# Patient Record
Sex: Female | Born: 1990 | Race: White | Hispanic: No | Marital: Single | Smoking: Current every day smoker
Health system: Southern US, Community
[De-identification: ages and names within clinical notes are randomized; demographics above are authoritative.]

## PROBLEM LIST (undated history)

## (undated) DIAGNOSIS — IMO0002 Reserved for concepts with insufficient information to code with codable children: Secondary | ICD-10-CM

## (undated) DIAGNOSIS — F411 Generalized anxiety disorder: Secondary | ICD-10-CM

## (undated) DIAGNOSIS — F339 Major depressive disorder, recurrent, unspecified: Secondary | ICD-10-CM

## (undated) DIAGNOSIS — I959 Hypotension, unspecified: Secondary | ICD-10-CM

## (undated) DIAGNOSIS — N19 Unspecified kidney failure: Secondary | ICD-10-CM

## (undated) DIAGNOSIS — E079 Disorder of thyroid, unspecified: Secondary | ICD-10-CM

## (undated) DIAGNOSIS — E031 Congenital hypothyroidism without goiter: Secondary | ICD-10-CM

## (undated) DIAGNOSIS — F410 Panic disorder [episodic paroxysmal anxiety] without agoraphobia: Secondary | ICD-10-CM

## (undated) HISTORY — PX: NO PAST SURGERIES: SHX2092

## (undated) HISTORY — DX: Major depressive disorder, recurrent, unspecified: F33.9

## (undated) HISTORY — DX: Panic disorder (episodic paroxysmal anxiety): F41.0

## (undated) HISTORY — DX: Congenital hypothyroidism without goiter: E03.1

## (undated) HISTORY — DX: Generalized anxiety disorder: F41.1

## (undated) HISTORY — PX: OVARIAN CYST REMOVAL: SHX89

## (undated) HISTORY — DX: Reserved for concepts with insufficient information to code with codable children: IMO0002

## (undated) HISTORY — DX: Disorder of thyroid, unspecified: E07.9

---
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
## 2011-01-08 ENCOUNTER — Other Ambulatory Visit: Payer: Self-pay | Admitting: "Endocrinology

## 2011-01-09 NOTE — Telephone Encounter (Signed)
Have patient call office

## 2011-01-13 ENCOUNTER — Other Ambulatory Visit: Payer: Self-pay | Admitting: "Endocrinology

## 2011-01-13 ENCOUNTER — Encounter: Payer: Self-pay | Admitting: *Deleted

## 2011-01-23 ENCOUNTER — Encounter: Payer: Self-pay | Admitting: Maternal & Fetal Medicine

## 2011-05-20 ENCOUNTER — Observation Stay: Payer: Self-pay | Admitting: Obstetrics & Gynecology

## 2011-06-18 ENCOUNTER — Inpatient Hospital Stay: Payer: Self-pay | Admitting: Obstetrics and Gynecology

## 2011-07-11 ENCOUNTER — Inpatient Hospital Stay: Payer: Self-pay | Admitting: Obstetrics & Gynecology

## 2011-07-16 LAB — PATHOLOGY REPORT

## 2011-12-25 ENCOUNTER — Emergency Department: Payer: Self-pay | Admitting: *Deleted

## 2011-12-25 LAB — URINALYSIS, COMPLETE
Bilirubin,UR: NEGATIVE
Ketone: NEGATIVE
Ph: 5 (ref 4.5–8.0)
Protein: 30
RBC,UR: 10 /HPF (ref 0–5)
Specific Gravity: 1.024 (ref 1.003–1.030)
Squamous Epithelial: 4
WBC UR: 15 /HPF (ref 0–5)

## 2011-12-25 LAB — PREGNANCY, URINE: Pregnancy Test, Urine: NEGATIVE m[IU]/mL

## 2011-12-27 ENCOUNTER — Emergency Department: Payer: Self-pay | Admitting: Emergency Medicine

## 2011-12-30 ENCOUNTER — Ambulatory Visit: Payer: Self-pay | Admitting: Family Medicine

## 2011-12-30 ENCOUNTER — Encounter: Payer: Self-pay | Admitting: Family Medicine

## 2012-01-31 ENCOUNTER — Emergency Department: Payer: Self-pay | Admitting: Emergency Medicine

## 2012-01-31 LAB — URINALYSIS, COMPLETE
Glucose,UR: NEGATIVE mg/dL (ref 0–75)
Hyaline Cast: 2
Ketone: NEGATIVE
Nitrite: NEGATIVE
Ph: 5 (ref 4.5–8.0)
Protein: 30
Specific Gravity: 1.027 (ref 1.003–1.030)
WBC UR: 18 /HPF (ref 0–5)

## 2012-01-31 LAB — BASIC METABOLIC PANEL
BUN: 30 mg/dL — ABNORMAL HIGH (ref 7–18)
Chloride: 106 mmol/L (ref 98–107)
Creatinine: 1.31 mg/dL — ABNORMAL HIGH (ref 0.60–1.30)
EGFR (Non-African Amer.): 59 — ABNORMAL LOW
Potassium: 4.2 mmol/L (ref 3.5–5.1)
Sodium: 138 mmol/L (ref 136–145)

## 2012-01-31 LAB — CBC
HCT: 37.2 % (ref 35.0–47.0)
MCH: 31.8 pg (ref 26.0–34.0)
MCHC: 33.3 g/dL (ref 32.0–36.0)
RDW: 15.1 % — ABNORMAL HIGH (ref 11.5–14.5)

## 2012-01-31 LAB — PREGNANCY, URINE: Pregnancy Test, Urine: NEGATIVE m[IU]/mL

## 2012-02-12 ENCOUNTER — Emergency Department: Payer: Self-pay | Admitting: Emergency Medicine

## 2012-02-12 LAB — CBC
HGB: 12.2 g/dL (ref 12.0–16.0)
MCH: 31.1 pg (ref 26.0–34.0)
MCV: 96 fL (ref 80–100)
Platelet: 237 10*3/uL (ref 150–440)
RBC: 3.91 10*6/uL (ref 3.80–5.20)
RDW: 15 % — ABNORMAL HIGH (ref 11.5–14.5)
WBC: 8.6 10*3/uL (ref 3.6–11.0)

## 2012-02-12 LAB — URINALYSIS, COMPLETE
Specific Gravity: 1.022 (ref 1.003–1.030)
WBC UR: 122 /HPF (ref 0–5)

## 2012-02-12 LAB — TSH: Thyroid Stimulating Horm: >100 u[IU]/mL — ABNORMAL HIGH

## 2012-02-12 LAB — PREGNANCY, URINE: Pregnancy Test, Urine: NEGATIVE m[IU]/mL

## 2012-02-12 LAB — WET PREP, GENITAL

## 2012-04-14 ENCOUNTER — Emergency Department: Payer: Self-pay | Admitting: Emergency Medicine

## 2012-04-14 LAB — URINALYSIS, COMPLETE
Bacteria: NONE SEEN
Glucose,UR: NEGATIVE mg/dL (ref 0–75)
Specific Gravity: 1.019 (ref 1.003–1.030)
Squamous Epithelial: 10
WBC UR: 615 /HPF (ref 0–5)

## 2012-04-17 ENCOUNTER — Emergency Department: Payer: Self-pay | Admitting: Unknown Physician Specialty

## 2012-04-17 LAB — URINALYSIS, COMPLETE
Glucose,UR: NEGATIVE mg/dL (ref 0–75)
Nitrite: NEGATIVE
Ph: 6 (ref 4.5–8.0)
Protein: >=500
Specific Gravity: 1.023 (ref 1.003–1.030)
Transitional Epi: 3
WBC UR: 635 /HPF (ref 0–5)

## 2012-04-17 LAB — COMPREHENSIVE METABOLIC PANEL
Albumin: 5 g/dL (ref 3.4–5.0)
Anion Gap: 10 (ref 7–16)
BUN: 25 mg/dL — ABNORMAL HIGH (ref 7–18)
Bilirubin,Total: 0.5 mg/dL (ref 0.2–1.0)
Calcium, Total: 9.4 mg/dL (ref 8.5–10.1)
Creatinine: 1.41 mg/dL — ABNORMAL HIGH (ref 0.60–1.30)
Glucose: 96 mg/dL (ref 65–99)
Osmolality: 286 (ref 275–301)
SGOT(AST): 26 U/L (ref 15–37)
SGPT (ALT): 34 U/L (ref 12–78)
Sodium: 141 mmol/L (ref 136–145)
Total Protein: 8.8 g/dL — ABNORMAL HIGH (ref 6.4–8.2)

## 2012-04-17 LAB — PROTIME-INR: INR: 1

## 2012-04-17 LAB — LIPASE, BLOOD: Lipase: 84 U/L (ref 73–393)

## 2012-04-17 LAB — CBC
HCT: 36.5 % (ref 35.0–47.0)
MCV: 96 fL (ref 80–100)
Platelet: 248 10*3/uL (ref 150–440)
RBC: 3.81 10*6/uL (ref 3.80–5.20)
WBC: 9.7 10*3/uL (ref 3.6–11.0)

## 2012-04-17 LAB — TSH: Thyroid Stimulating Horm: 99.7 u[IU]/mL — ABNORMAL HIGH

## 2012-07-28 LAB — CBC
HCT: 40.5 % (ref 35.0–47.0)
HGB: 13.5 g/dL (ref 12.0–16.0)
MCH: 33.4 pg (ref 26.0–34.0)
MCHC: 33.5 g/dL (ref 32.0–36.0)
MCV: 100 fL (ref 80–100)
Platelet: 228 x10 3/mm 3 (ref 150–440)
RBC: 4.05 X10 6/mm 3 (ref 3.80–5.20)
RDW: 14 % (ref 11.5–14.5)
WBC: 11.9 x10 3/mm 3 — ABNORMAL HIGH (ref 3.6–11.0)

## 2012-07-28 LAB — COMPREHENSIVE METABOLIC PANEL
Albumin: 4.6 g/dL (ref 3.4–5.0)
Alkaline Phosphatase: 71 U/L (ref 50–136)
Anion Gap: 9 (ref 7–16)
Bilirubin,Total: 0.4 mg/dL (ref 0.2–1.0)
Calcium, Total: 8.6 mg/dL (ref 8.5–10.1)
Co2: 25 mmol/L (ref 21–32)
Creatinine: 1.09 mg/dL (ref 0.60–1.30)
EGFR (Non-African Amer.): >60
Glucose: 167 mg/dL — ABNORMAL HIGH (ref 65–99)
Osmolality: 284 (ref 275–301)
SGOT(AST): 25 U/L (ref 15–37)
SGPT (ALT): 21 U/L (ref 12–78)

## 2012-07-28 LAB — DRUG SCREEN, URINE
Amphetamines, Ur Screen: NEGATIVE (ref ?–1000)
Barbiturates, Ur Screen: NEGATIVE (ref ?–200)
Benzodiazepine, Ur Scrn: POSITIVE (ref ?–200)
Cannabinoid 50 Ng, Ur ~~LOC~~: POSITIVE (ref ?–50)
Methadone, Ur Screen: NEGATIVE (ref ?–300)
Opiate, Ur Screen: NEGATIVE (ref ?–300)
Tricyclic, Ur Screen: NEGATIVE (ref ?–1000)

## 2012-07-28 LAB — ETHANOL
Ethanol %: <0.003 % (ref 0.000–0.080)
Ethanol: <3 mg/dL

## 2012-07-28 LAB — URINALYSIS, COMPLETE
Ketone: NEGATIVE
Protein: 30
RBC,UR: 3 /HPF (ref 0–5)

## 2012-07-28 LAB — ACETAMINOPHEN LEVEL: Acetaminophen: <2 ug/mL

## 2012-07-28 LAB — SALICYLATE LEVEL: Salicylates, Serum: 4.4 mg/dL — ABNORMAL HIGH

## 2012-07-29 ENCOUNTER — Inpatient Hospital Stay: Payer: Self-pay | Admitting: Internal Medicine

## 2012-07-30 ENCOUNTER — Inpatient Hospital Stay: Payer: Self-pay | Admitting: Psychiatry

## 2012-07-30 LAB — URINE CULTURE

## 2012-12-19 ENCOUNTER — Emergency Department: Payer: Self-pay | Admitting: Emergency Medicine

## 2012-12-19 ENCOUNTER — Ambulatory Visit: Payer: Self-pay | Admitting: Family Medicine

## 2012-12-19 LAB — URINALYSIS, COMPLETE
Bilirubin,UR: NEGATIVE
Glucose,UR: NEGATIVE mg/dL (ref 0–75)
Glucose,UR: NEGATIVE mg/dL (ref 0–75)
Nitrite: NEGATIVE
Ph: 7 (ref 4.5–8.0)
Protein: NEGATIVE
Squamous Epithelial: 12

## 2012-12-19 LAB — CBC
HGB: 12.7 g/dL (ref 12.0–16.0)
Platelet: 200 10*3/uL (ref 150–440)
RBC: 3.82 10*6/uL (ref 3.80–5.20)
RDW: 15.7 % — ABNORMAL HIGH (ref 11.5–14.5)

## 2012-12-19 LAB — BASIC METABOLIC PANEL
Anion Gap: 4 — ABNORMAL LOW (ref 7–16)
Co2: 27 mmol/L (ref 21–32)
EGFR (Non-African Amer.): >60
Glucose: 104 mg/dL — ABNORMAL HIGH (ref 65–99)
Osmolality: 272 (ref 275–301)
Potassium: 3.9 mmol/L (ref 3.5–5.1)
Sodium: 136 mmol/L (ref 136–145)

## 2013-02-21 ENCOUNTER — Ambulatory Visit: Payer: Self-pay

## 2013-03-01 ENCOUNTER — Ambulatory Visit: Payer: Self-pay | Admitting: Family Medicine

## 2013-03-09 ENCOUNTER — Emergency Department: Payer: Self-pay | Admitting: Emergency Medicine

## 2013-03-25 ENCOUNTER — Observation Stay: Payer: Self-pay | Admitting: Obstetrics and Gynecology

## 2013-03-25 LAB — URINALYSIS, COMPLETE
Blood: NEGATIVE
Glucose,UR: NEGATIVE mg/dL (ref 0–75)
Ketone: NEGATIVE
Nitrite: NEGATIVE
RBC,UR: 4 /HPF (ref 0–5)
Squamous Epithelial: 2
WBC UR: 13 /HPF (ref 0–5)

## 2013-03-27 LAB — URINE CULTURE

## 2013-04-03 ENCOUNTER — Observation Stay: Payer: Self-pay

## 2013-04-03 LAB — FETAL FIBRONECTIN: Fetal Fibronectin: NEGATIVE

## 2013-04-27 ENCOUNTER — Ambulatory Visit: Payer: Self-pay | Admitting: Podiatry

## 2013-05-02 ENCOUNTER — Observation Stay: Payer: Self-pay

## 2013-05-02 LAB — URINALYSIS, COMPLETE
Bilirubin,UR: NEGATIVE
Blood: NEGATIVE
Glucose,UR: NEGATIVE mg/dL (ref 0–75)
Ph: 6 (ref 4.5–8.0)
Protein: NEGATIVE
RBC,UR: NONE SEEN /HPF (ref 0–5)
Specific Gravity: 1.017 (ref 1.003–1.030)
Squamous Epithelial: 22
WBC UR: 5 /HPF (ref 0–5)

## 2013-05-02 LAB — FETAL FIBRONECTIN

## 2013-06-07 ENCOUNTER — Observation Stay: Payer: Self-pay | Admitting: Obstetrics and Gynecology

## 2013-06-13 ENCOUNTER — Observation Stay: Payer: Self-pay | Admitting: Obstetrics and Gynecology

## 2013-06-14 ENCOUNTER — Inpatient Hospital Stay: Payer: Self-pay | Admitting: Obstetrics and Gynecology

## 2013-06-14 LAB — CBC WITH DIFFERENTIAL/PLATELET
Basophil %: 0.4 %
Eosinophil #: 0 10*3/uL (ref 0.0–0.7)
Eosinophil %: 0.1 %
Lymphocyte #: 3 10*3/uL (ref 1.0–3.6)
Lymphocyte %: 13.7 %
MCH: 33.1 pg (ref 26.0–34.0)
MCHC: 34 g/dL (ref 32.0–36.0)
Monocyte #: 1.1 x10 3/mm — ABNORMAL HIGH (ref 0.2–0.9)
Monocyte %: 4.9 %
Neutrophil %: 80.9 %
Platelet: 181 10*3/uL (ref 150–440)
RDW: 14.7 % — ABNORMAL HIGH (ref 11.5–14.5)
WBC: 22.2 10*3/uL — ABNORMAL HIGH (ref 3.6–11.0)

## 2013-06-14 LAB — TSH: Thyroid Stimulating Horm: >100 u[IU]/mL — ABNORMAL HIGH

## 2013-06-15 ENCOUNTER — Inpatient Hospital Stay: Payer: Self-pay | Admitting: Obstetrics and Gynecology

## 2013-06-15 LAB — CBC WITH DIFFERENTIAL/PLATELET
Basophil %: 0.4 %
HGB: 11.1 g/dL — ABNORMAL LOW (ref 12.0–16.0)
Lymphocyte #: 3.1 10*3/uL (ref 1.0–3.6)
MCH: 33.5 pg (ref 26.0–34.0)
MCHC: 34.1 g/dL (ref 32.0–36.0)
Monocyte %: 5 %
Neutrophil #: 21.1 10*3/uL — ABNORMAL HIGH (ref 1.4–6.5)
Neutrophil %: 82.5 %
Platelet: 184 10*3/uL (ref 150–440)
WBC: 25.6 10*3/uL — ABNORMAL HIGH (ref 3.6–11.0)

## 2013-06-16 LAB — GC/CHLAMYDIA PROBE AMP

## 2013-06-16 LAB — HEMATOCRIT: HCT: 20.2 % — ABNORMAL LOW (ref 35.0–47.0)

## 2013-06-17 LAB — CBC WITH DIFFERENTIAL/PLATELET
Basophil #: 0.1 10*3/uL (ref 0.0–0.1)
Basophil %: 0.2 %
Eosinophil #: 0.2 10*3/uL (ref 0.0–0.7)
Eosinophil %: 0.6 %
HCT: 18.3 % — ABNORMAL LOW (ref 35.0–47.0)
HGB: 6.2 g/dL — ABNORMAL LOW (ref 12.0–16.0)
MCHC: 33.8 g/dL (ref 32.0–36.0)
Neutrophil #: 27.3 10*3/uL — ABNORMAL HIGH (ref 1.4–6.5)
Neutrophil %: 79.9 %
Platelet: 154 10*3/uL (ref 150–440)
RDW: 14.4 % (ref 11.5–14.5)

## 2013-06-17 LAB — PATHOLOGY REPORT

## 2013-06-17 LAB — URINE CULTURE

## 2013-06-18 LAB — CBC WITH DIFFERENTIAL/PLATELET
Eosinophil #: 0.2 10*3/uL (ref 0.0–0.7)
HCT: 18.8 % — ABNORMAL LOW (ref 35.0–47.0)
MCHC: 34.2 g/dL (ref 32.0–36.0)
MCV: 98 fL (ref 80–100)
Neutrophil #: 16.4 10*3/uL — ABNORMAL HIGH (ref 1.4–6.5)
Neutrophil %: 71.6 %
RBC: 1.92 10*6/uL — ABNORMAL LOW (ref 3.80–5.20)
RDW: 14.4 % (ref 11.5–14.5)
WBC: 22.9 10*3/uL — ABNORMAL HIGH (ref 3.6–11.0)

## 2014-03-03 ENCOUNTER — Emergency Department (HOSPITAL_COMMUNITY)
Admission: EM | Admit: 2014-03-03 | Discharge: 2014-03-03 | Disposition: A | Discharge status: Home / Self Care | Payer: BC Managed Care – PPO | Attending: Emergency Medicine | Admitting: Emergency Medicine

## 2014-03-03 ENCOUNTER — Encounter (HOSPITAL_COMMUNITY): Payer: Self-pay | Admitting: Emergency Medicine

## 2014-03-03 DIAGNOSIS — F17200 Nicotine dependence, unspecified, uncomplicated: Secondary | ICD-10-CM | POA: Insufficient documentation

## 2014-03-03 DIAGNOSIS — E039 Hypothyroidism, unspecified: Secondary | ICD-10-CM | POA: Insufficient documentation

## 2014-03-03 DIAGNOSIS — N12 Tubulo-interstitial nephritis, not specified as acute or chronic: Secondary | ICD-10-CM

## 2014-03-03 DIAGNOSIS — Z792 Long term (current) use of antibiotics: Secondary | ICD-10-CM | POA: Insufficient documentation

## 2014-03-03 DIAGNOSIS — R1013 Epigastric pain: Secondary | ICD-10-CM | POA: Insufficient documentation

## 2014-03-03 DIAGNOSIS — Z3202 Encounter for pregnancy test, result negative: Secondary | ICD-10-CM | POA: Insufficient documentation

## 2014-03-03 DIAGNOSIS — Z79899 Other long term (current) drug therapy: Secondary | ICD-10-CM | POA: Insufficient documentation

## 2014-03-03 LAB — CBC WITH DIFFERENTIAL/PLATELET
BASOS ABS: 0.1 10*3/uL (ref 0.0–0.1)
Basophils Relative: 0 % (ref 0–1)
EOS PCT: 1 % (ref 0–5)
Eosinophils Absolute: 0.1 10*3/uL (ref 0.0–0.7)
HCT: 38.9 % (ref 36.0–46.0)
Hemoglobin: 12.7 g/dL (ref 12.0–15.0)
LYMPHS ABS: 4.5 10*3/uL — AB (ref 0.7–4.0)
LYMPHS PCT: 33 % (ref 12–46)
MCH: 30.9 pg (ref 26.0–34.0)
MCHC: 32.6 g/dL (ref 30.0–36.0)
MCV: 94.6 fL (ref 78.0–100.0)
Monocytes Absolute: 0.5 10*3/uL (ref 0.1–1.0)
Monocytes Relative: 4 % (ref 3–12)
NEUTROS PCT: 62 % (ref 43–77)
Neutro Abs: 8.4 10*3/uL — ABNORMAL HIGH (ref 1.7–7.7)
PLATELETS: 223 10*3/uL (ref 150–400)
RBC: 4.11 MIL/uL (ref 3.87–5.11)
RDW: 16.8 % — AB (ref 11.5–15.5)
WBC: 13.5 10*3/uL — AB (ref 4.0–10.5)

## 2014-03-03 LAB — URINE MICROSCOPIC-ADD ON

## 2014-03-03 LAB — COMPREHENSIVE METABOLIC PANEL
ALK PHOS: 69 U/L (ref 39–117)
ALT: 29 U/L (ref 0–35)
AST: 33 U/L (ref 0–37)
Albumin: 4.6 g/dL (ref 3.5–5.2)
Anion gap: 13 (ref 5–15)
BUN: 21 mg/dL (ref 6–23)
CO2: 25 meq/L (ref 19–32)
Calcium: 8.8 mg/dL (ref 8.4–10.5)
Chloride: 104 mEq/L (ref 96–112)
Creatinine, Ser: 1.22 mg/dL — ABNORMAL HIGH (ref 0.50–1.10)
GFR calc Af Amer: 72 mL/min — ABNORMAL LOW (ref >90)
GFR calc non Af Amer: 62 mL/min — ABNORMAL LOW (ref >90)
Glucose, Bld: 101 mg/dL — ABNORMAL HIGH (ref 70–99)
POTASSIUM: 4.4 meq/L (ref 3.7–5.3)
SODIUM: 142 meq/L (ref 137–147)
Total Bilirubin: <0.2 mg/dL — ABNORMAL LOW (ref 0.3–1.2)
Total Protein: 8 g/dL (ref 6.0–8.3)

## 2014-03-03 LAB — URINALYSIS, ROUTINE W REFLEX MICROSCOPIC
Bilirubin Urine: NEGATIVE
GLUCOSE, UA: NEGATIVE mg/dL
Ketones, ur: NEGATIVE mg/dL
Nitrite: NEGATIVE
PH: 7 (ref 5.0–8.0)
Protein, ur: NEGATIVE mg/dL
SPECIFIC GRAVITY, URINE: 1.025 (ref 1.005–1.030)
Urobilinogen, UA: 1 mg/dL (ref 0.0–1.0)

## 2014-03-03 LAB — LIPASE, BLOOD: LIPASE: 46 U/L (ref 11–59)

## 2014-03-03 LAB — POC URINE PREG, ED: Preg Test, Ur: NEGATIVE

## 2014-03-03 MED ORDER — CEPHALEXIN 500 MG PO CAPS
500.0000 mg | ORAL_CAPSULE | Freq: Three times a day (TID) | ORAL | Status: AC
Start: 2014-03-03 — End: 2014-03-17

## 2014-03-03 MED ORDER — DEXTROSE 5 % IV SOLN
1.0000 g | Freq: Once | INTRAVENOUS | Status: AC
  Administered 2014-03-03: 1 g via INTRAVENOUS
  Filled 2014-03-03: qty 10

## 2014-03-03 MED ORDER — SODIUM CHLORIDE 0.9 % IV BOLUS (SEPSIS)
2000.0000 mL | Freq: Once | INTRAVENOUS | Status: AC
  Administered 2014-03-03: 2000 mL via INTRAVENOUS

## 2014-03-03 MED ORDER — OXYCODONE-ACETAMINOPHEN 5-325 MG PO TABS: ORAL_TABLET | ORAL | Status: DC

## 2014-03-03 MED ORDER — HYDROMORPHONE HCL PF 1 MG/ML IJ SOLN
0.5000 mg | Freq: Once | INTRAMUSCULAR | Status: AC
  Administered 2014-03-03: 0.5 mg via INTRAVENOUS
  Filled 2014-03-03: qty 1

## 2014-03-03 MED ORDER — MORPHINE SULFATE 4 MG/ML IJ SOLN
4.0000 mg | Freq: Once | INTRAMUSCULAR | Status: AC
  Administered 2014-03-03: 4 mg via INTRAVENOUS
  Filled 2014-03-03: qty 1

## 2014-03-03 MED ORDER — IOHEXOL 300 MG/ML  SOLN
50.0000 mL | Freq: Once | INTRAMUSCULAR | Status: AC | PRN
  Administered 2014-03-03 (×2): 25 mL via ORAL

## 2014-03-03 NOTE — ED Provider Notes (Signed)
CSN: [id number]     Arrival date & time 03/03/14  1232 History   First MD Initiated Contact with Patient 03/03/14 1245     Chief Complaint  Patient presents with  . Abdominal Pain     (Consider location/radiation/quality/duration/timing/severity/associated sxs/prior Treatment) HPI  Martha Proctor is a 23 y.o. female complaining of worsening epigastric abdominal pain over the course of 3 days associated with urinary frequency, dark-colored foul-smelling urine and decreased by mouth intake over the last 24 hours. Patient states that she has pain in the bilateral kidneys as well, has history of frequent pyelonephritis. She denies fever, chills, nausea, vomiting, abnormal vaginal discharge, chest pain, cough, shortness of breath, nausea or vomiting. No pain medication prior to arrival. Pain is exacerbated by movement. Rated at 10 out of 10, unable to describe nature of pain. No alleviating factors identified.  Past Medical History  Diagnosis Date  . 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 No pertinent past surgical history. History reviewed. No pertinent family history. History  Substance Use Topics  . Smoking status: Current Every Day Smoker  . Smokeless tobacco: Not on file  . Alcohol Use: No   OB History   Grav Para Term Preterm Abortions TAB SAB Ect Mult Living                 Review of Systems  10 systems reviewed and found to be negative, except as noted in the HPI.   Allergies  Review of patient's allergies indicates no known allergies.  Home Medications   Prior to Admission medications   Medication Sig Start Date End Date Taking? Authorizing Provider  FLUoxetine (PROZAC) 10 MG capsule Take 10 mg by mouth daily.   Yes Historical Provider, MD  levothyroxine (SYNTHROID, LEVOTHROID) 200 MCG tablet Take 200 mcg by mouth daily.     Yes Historical Provider, MD  cephALEXin (KEFLEX) 500 MG capsule Take 1 capsule (500 mg total) by mouth 3  (three) times daily. 03/03/14 03/17/14  Jedediah Noda, PA-C  oxyCODONE-acetaminophen (PERCOCET/ROXICET) 5-325 MG per tablet 1 to 2 tabs PO q6hrs  PRN for pain 03/03/14   Joni Reining Letetia Romanello, PA-C   BP 122/83  Pulse 82  Temp(Src) 98.4 F (36.9 C) (Oral)  Resp 20  Ht 5' (1.524 m)  Wt 98 lb (44.453 kg)  BMI 19.14 kg/m2  SpO2 100% Physical Exam  Nursing note and vitals reviewed. Constitutional: She is oriented to person, place, and time. She appears well-developed and well-nourished. No distress.  HENT:  Head: Normocephalic.  Mouth/Throat: Oropharynx is clear and moist.  Eyes: Conjunctivae and EOM are normal. Pupils are equal, round, and reactive to light.  Neck: Normal range of motion.  Cardiovascular: Normal rate, regular rhythm and intact distal pulses.   Pulmonary/Chest: Effort normal. No stridor.  Abdominal: She exhibits no distension and no mass. There is tenderness. There is guarding. There is no rebound.  Hyperactive bowel sounds, patient is diffusely tender to palpation with voluntary guarding and no rebound. She is exquisitely tender to palpation in the epigastrium.  Genitourinary:  Exquisite bilateral CVA tenderness to palpation  Musculoskeletal: Normal range of motion.  Neurological: She is alert and oriented to person, place, and time.  Psychiatric: She has a normal mood and affect.    ED Course  Procedures (including critical care time) Labs Review Labs Reviewed  CBC WITH DIFFERENTIAL - Abnormal; Notable for the following:    WBC 13.5 (*)    RDW 16.8 (*)  Neutro Abs 8.4 (*)    Lymphs Abs 4.5 (*)    All other components within normal limits  COMPREHENSIVE METABOLIC PANEL - Abnormal; Notable for the following:    Glucose, Bld 101 (*)    Creatinine, Ser 1.22 (*)    Total Bilirubin <0.2 (*)    GFR calc non Af Amer 62 (*)    GFR calc Af Amer 72 (*)    All other components within normal limits  URINALYSIS, ROUTINE W REFLEX MICROSCOPIC - Abnormal; Notable for the  following:    APPearance CLOUDY (*)    Hgb urine dipstick SMALL (*)    Leukocytes, UA MODERATE (*)    All other components within normal limits  URINE MICROSCOPIC-ADD ON - Abnormal; Notable for the following:    Squamous Epithelial / LPF MANY (*)    Bacteria, UA MANY (*)    All other components within normal limits  URINE CULTURE  LIPASE, BLOOD  POC URINE PREG, ED    Imaging Review No results found.   EKG Interpretation None      MDM   Final diagnoses:  Pyelonephritis    Filed Vitals:   03/03/14 1239  BP: 122/83  Pulse: 82  Temp: 98.4 F (36.9 C)  TempSrc: Oral  Resp: 20  Height: 5' (1.524 m)  Weight: 98 lb (44.453 kg)  SpO2: 100%    Medications  morphine 4 MG/ML injection 4 mg (4 mg Intravenous Given 03/03/14 1352)  iohexol (OMNIPAQUE) 300 MG/ML solution 50 mL (25 mLs Oral Contrast Given 03/03/14 1500)  sodium chloride 0.9 % bolus 2,000 mL (2,000 mLs Intravenous New Bag/Given 03/03/14 1352)  cefTRIAXone (ROCEPHIN) 1 g in dextrose 5 % 50 mL IVPB (0 g Intravenous Stopped 03/03/14 1508)  HYDROmorphone (DILAUDID) injection 0.5 mg (0.5 mg Intravenous Given 03/03/14 1508)    Martha Proctor is a 23 y.o. female presenting with epigastric (note that this contradicts triage note) abdominal pain associated with urinary frequency, foul-smelling and concentrated urine, bilateral flank pain and decreased by mouth intake. On physical exam the patient has exquisitely tender to palpation abdomen. This could be a typical for pyelonephritis. Will order CT abdomen pelvis with contrast to reassess.  On repeat abdominal exam she has no lumbar tender to palpation along the epigastrium, she still has bilateral CVA tenderness.  3:45 PM patient reassessed with benign abdominal exam, no tenderness to palpation. I have discussed the pros and cons of obtaining CT scan, and shared decision-making capacity we have chosen to defer the CT scan exam at this time. We have had an extensive discussion of  return precautions. We'll treat for pyelonephritis with Keflex, urine culture is sent. Patient will be given Percocet for pain control.  Evaluation does not show pathology that would require ongoing emergent intervention or inpatient treatment. Pt is hemodynamically stable and mentating appropriately. Discussed findings and plan with patient/guardian, who agrees with care plan. All questions answered. Return precautions discussed and outpatient follow up given.   New Prescriptions   CEPHALEXIN (KEFLEX) 500 MG CAPSULE    Take 1 capsule (500 mg total) by mouth 3 (three) times daily.   OXYCODONE-ACETAMINOPHEN (PERCOCET/ROXICET) 5-325 MG PER TABLET    1 to 2 tabs PO q6hrs  PRN for pain         Martha Emeryicole Kariana Wiles, PA-C 03/03/14 1601

## 2014-03-03 NOTE — Discharge Instructions (Signed)
Take your antibiotics as directed and to completion. You should never have any leftover antibiotics! Push fluids and stay well hydrated.   Push fluids: take small frequent sips of water or Gatorade, do not drink any soda, juice or caffeinated beverages.    Take percocet for breakthrough pain, do not drink alcohol, drive, care for children or do other critical tasks while taking percocet.  Please follow with your primary care doctor in the next 2 days for a check-up. They must obtain records for further management.   Do not hesitate to return to the Emergency Department for any new, worsening or concerning symptoms.    Pyelonephritis, Adult Pyelonephritis is a kidney infection. In general, there are 2 main types of pyelonephritis:  Infections that come on quickly without any warning (acute pyelonephritis).  Infections that persist for a long period of time (chronic pyelonephritis). CAUSES  Two main causes of pyelonephritis are:  Bacteria traveling from the bladder to the kidney. This is a problem especially in pregnant women. The urine in the bladder can become filled with bacteria from multiple causes, including:  Inflammation of the prostate gland (prostatitis).  Sexual intercourse in females.  Bladder infection (cystitis).  Bacteria traveling from the bloodstream to the tissue part of the kidney. Problems that may increase your risk of getting a kidney infection include:  Diabetes.  Kidney stones or bladder stones.  Cancer.  Catheters placed in the bladder.  Other abnormalities of the kidney or ureter. SYMPTOMS   Abdominal pain.  Pain in the side or flank area.  Fever.  Chills.  Upset stomach.  Blood in the urine (dark urine).  Frequent urination.  Strong or persistent urge to urinate.  Burning or stinging when urinating. DIAGNOSIS  Your caregiver may diagnose your kidney infection based on your symptoms. A urine sample may also be taken. TREATMENT    In general, treatment depends on how severe the infection is.   If the infection is mild and caught early, your caregiver may treat you with oral antibiotics and send you home.  If the infection is more severe, the bacteria may have gotten into the bloodstream. This will require intravenous (IV) antibiotics and a hospital stay. Symptoms may include:  High fever.  Severe flank pain.  Shaking chills.  Even after a hospital stay, your caregiver may require you to be on oral antibiotics for a period of time.  Other treatments may be required depending upon the cause of the infection. HOME CARE INSTRUCTIONS   Take your antibiotics as directed. Finish them even if you start to feel better.  Make an appointment to have your urine checked to make sure the infection is gone.  Drink enough fluids to keep your urine clear or pale yellow.  Take medicines for the bladder if you have urgency and frequency of urination as directed by your caregiver. SEEK IMMEDIATE MEDICAL CARE IF:   You have a fever or persistent symptoms for more than 2-3 days.  You have a fever and your symptoms suddenly get worse.  You are unable to take your antibiotics or fluids.  You develop shaking chills.  You experience extreme weakness or fainting.  There is no improvement after 2 days of treatment. MAKE SURE YOU:  Understand these instructions.  Will watch your condition.  Will get help right away if you are not doing well or get worse. Document Released: 07/14/2005 Document Revised: 01/13/2012 Document Reviewed: 12/18/2010 [facility] : [facility] Patient Information 2015 [employer], [place]. This information is not intended to  replace advice given to you by your health care provider. Make sure you discuss any questions you have with your health care provider.

## 2014-03-03 NOTE — ED Provider Notes (Signed)
Medical screening examination/treatment/procedure(s) were performed by non-physician practitioner and as supervising physician I was immediately available for consultation/collaboration.   EKG Interpretation None        Layla MawKristen N Ward, DO 03/03/14 1606

## 2014-03-03 NOTE — ED Notes (Signed)
Per pt sts 3 days of lower abdominal pain and radiating into her kidneys. sts she thinks she has a kidney infection.

## 2014-03-06 LAB — URINE CULTURE: Colony Count: >=100000

## 2014-03-07 ENCOUNTER — Telehealth (HOSPITAL_BASED_OUTPATIENT_CLINIC_OR_DEPARTMENT_OTHER): Payer: Self-pay | Admitting: Emergency Medicine

## 2014-03-07 NOTE — Telephone Encounter (Signed)
Post ED Visit - Positive Culture Follow-up  Culture report reviewed by antimicrobial stewardship pharmacist: []  Wes Dulaney, Pharm.D., BCPS []  Celedonio MiyamotoJeremy Frens, Pharm.D., BCPS []  Georgina PillionElizabeth Martin, Pharm.D., BCPS []  MerrimacMinh Pham, 17[address] Rainbow BoulevardPharm.D., BCPS, AAHIVP []  Estella HuskMichelle Turner, Pharm.D., BCPS, AAHIVP [x]  Red ChristiansSamson Lee, Pharm.D. []  Tennis Mustassie Stewart, Pharm.D.  Positive urine culture >100,000 colonie/ml E. Coli Treated with Cephalexin 500 mg caps, 1 capsule tid x 14 days, organism sensitive to the same and no further patient follow-up is required at this time.  Berle MullMiller, Codi Folkerts 03/07/2014, 11:20 AM

## 2014-05-19 ENCOUNTER — Ambulatory Visit: Payer: Self-pay | Admitting: Emergency Medicine

## 2014-05-19 ENCOUNTER — Ambulatory Visit: Payer: Self-pay | Admitting: Family Medicine

## 2014-05-19 LAB — CBC WITH DIFFERENTIAL/PLATELET
Basophil #: 0.1 10*3/uL (ref 0.0–0.1)
Basophil %: 0.8 %
Eosinophil #: 0 10*3/uL (ref 0.0–0.7)
Eosinophil %: 0.2 %
HCT: 36.7 % (ref 35.0–47.0)
HGB: 12.3 g/dL (ref 12.0–16.0)
Lymphocyte #: 2.4 10*3/uL (ref 1.0–3.6)
Lymphocyte %: 13.7 %
MCH: 33.5 pg (ref 26.0–34.0)
MCHC: 33.7 g/dL (ref 32.0–36.0)
MCV: 99 fL (ref 80–100)
Monocyte #: 0.7 x10 3/mm (ref 0.2–0.9)
Monocyte %: 4.1 %
Neutrophil #: 14.3 10*3/uL — ABNORMAL HIGH (ref 1.4–6.5)
Neutrophil %: 81.2 %
Platelet: 278 10*3/uL (ref 150–440)
RBC: 3.69 10*6/uL — ABNORMAL LOW (ref 3.80–5.20)
RDW: 13.9 % (ref 11.5–14.5)
WBC: 17.6 10*3/uL — ABNORMAL HIGH (ref 3.6–11.0)

## 2014-05-19 LAB — URINALYSIS, COMPLETE
Bilirubin,UR: NEGATIVE
Glucose,UR: NEGATIVE
KETONE: NEGATIVE
NITRITE: POSITIVE
Ph: 5.5 (ref 5.0–8.0)
Protein: 100
WBC UR: >30 /HPF (ref 0–5)

## 2014-05-19 LAB — COMPREHENSIVE METABOLIC PANEL
Albumin: 3.8 g/dL (ref 3.4–5.0)
Alkaline Phosphatase: 61 U/L
Anion Gap: 8 (ref 7–16)
BUN: 18 mg/dL (ref 7–18)
Bilirubin,Total: 0.3 mg/dL (ref 0.2–1.0)
Calcium, Total: 8.5 mg/dL (ref 8.5–10.1)
Chloride: 103 mmol/L (ref 98–107)
Co2: 28 mmol/L (ref 21–32)
Creatinine: 0.9 mg/dL (ref 0.60–1.30)
EGFR (African American): >60
EGFR (Non-African Amer.): >60
Glucose: 96 mg/dL (ref 65–99)
Osmolality: 279 (ref 275–301)
Potassium: 4 mmol/L (ref 3.5–5.1)
SGOT(AST): 14 U/L — ABNORMAL LOW (ref 15–37)
SGPT (ALT): 17 U/L
Sodium: 139 mmol/L (ref 136–145)
Total Protein: 7 g/dL (ref 6.4–8.2)

## 2014-05-19 LAB — LIPASE, BLOOD: LIPASE: 76 U/L (ref 73–393)

## 2014-05-19 LAB — AMYLASE: AMYLASE: 40 U/L (ref 25–115)

## 2014-05-19 LAB — PREGNANCY, URINE: Pregnancy Test, Urine: NEGATIVE m[IU]/mL

## 2014-05-21 LAB — URINE CULTURE

## 2014-07-26 ENCOUNTER — Encounter: Payer: Self-pay | Admitting: Family Medicine

## 2014-07-26 ENCOUNTER — Ambulatory Visit (INDEPENDENT_AMBULATORY_CARE_PROVIDER_SITE_OTHER): Payer: BC Managed Care – PPO | Admitting: Family Medicine

## 2014-07-26 VITALS — BP 98/52 | HR 75 | Temp 98.2°F | Wt 101.0 lb

## 2014-07-26 DIAGNOSIS — F339 Major depressive disorder, recurrent, unspecified: Secondary | ICD-10-CM

## 2014-07-26 DIAGNOSIS — F410 Panic disorder [episodic paroxysmal anxiety] without agoraphobia: Secondary | ICD-10-CM | POA: Insufficient documentation

## 2014-07-26 DIAGNOSIS — F411 Generalized anxiety disorder: Secondary | ICD-10-CM

## 2014-07-26 DIAGNOSIS — R634 Abnormal weight loss: Secondary | ICD-10-CM

## 2014-07-26 DIAGNOSIS — IMO0002 Reserved for concepts with insufficient information to code with codable children: Secondary | ICD-10-CM

## 2014-07-26 DIAGNOSIS — Z62810 Personal history of physical and sexual abuse in childhood: Secondary | ICD-10-CM

## 2014-07-26 HISTORY — DX: Generalized anxiety disorder: F41.1

## 2014-07-26 HISTORY — DX: Reserved for concepts with insufficient information to code with codable children: IMO0002

## 2014-07-26 HISTORY — DX: Major depressive disorder, recurrent, unspecified: F33.9

## 2014-07-26 HISTORY — DX: Panic disorder (episodic paroxysmal anxiety): F41.0

## 2014-07-26 MED ORDER — CLONAZEPAM 1 MG PO TABS: 1.0000 mg | ORAL_TABLET | Freq: Two times a day (BID) | ORAL | Status: DC

## 2014-07-26 MED ORDER — ESCITALOPRAM OXALATE 5 MG PO TABS: 5.0000 mg | ORAL_TABLET | Freq: Every day | ORAL | Status: DC

## 2014-07-26 NOTE — Progress Notes (Signed)
Pre visit review using our clinic review tool, if applicable. No additional management support is needed unless otherwise documented below in the visit note. 
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
## 2014-08-02 ENCOUNTER — Telehealth: Payer: Self-pay | Admitting: Family Medicine

## 2014-08-02 NOTE — Telephone Encounter (Signed)
emmi mailed  °

## 2014-08-16 ENCOUNTER — Telehealth: Payer: Self-pay | Admitting: Family Medicine

## 2014-08-16 ENCOUNTER — Ambulatory Visit: Payer: BC Managed Care – PPO | Admitting: Family Medicine

## 2014-08-16 NOTE — Telephone Encounter (Signed)
Spoke with Martha Proctor. She states she totally forgot about her appointment today.  Other than the Klonopin making her sleepy, she states she is doing good and it has really helped.  She was thankful that Dr. Patsy Lageropland helped up out.  Appointment rescheduled for Monday 08/21/2014 at 10:15 am.

## 2014-08-16 NOTE — Telephone Encounter (Signed)
Patient did not come for their scheduled appointment today for 3 week follow up.  Please let me know if the patient needs to be contacted immediately for follow up or if no follow up is necessary.   ° °

## 2014-08-16 NOTE — Telephone Encounter (Signed)
Yes, donna, can you please check on her.

## 2014-08-21 ENCOUNTER — Encounter: Payer: Self-pay | Admitting: Family Medicine

## 2014-08-21 ENCOUNTER — Ambulatory Visit (INDEPENDENT_AMBULATORY_CARE_PROVIDER_SITE_OTHER): Payer: BLUE CROSS/BLUE SHIELD | Admitting: Family Medicine

## 2014-08-21 VITALS — BP 80/52 | HR 56 | Temp 98.9°F | Ht 60.5 in | Wt 96.5 lb

## 2014-08-21 DIAGNOSIS — F411 Generalized anxiety disorder: Secondary | ICD-10-CM

## 2014-08-21 DIAGNOSIS — Z62810 Personal history of physical and sexual abuse in childhood: Secondary | ICD-10-CM

## 2014-08-21 DIAGNOSIS — F339 Major depressive disorder, recurrent, unspecified: Secondary | ICD-10-CM

## 2014-08-21 DIAGNOSIS — IMO0002 Reserved for concepts with insufficient information to code with codable children: Secondary | ICD-10-CM

## 2014-08-21 DIAGNOSIS — F410 Panic disorder [episodic paroxysmal anxiety] without agoraphobia: Secondary | ICD-10-CM

## 2014-08-21 MED ORDER — ESCITALOPRAM OXALATE 10 MG PO TABS: 10.0000 mg | ORAL_TABLET | Freq: Every day | ORAL | Status: DC

## 2014-08-21 NOTE — Progress Notes (Signed)
Pre visit review using our clinic review tool, if applicable. No additional management support is needed unless otherwise documented below in the visit note. 
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
## 2014-09-05 ENCOUNTER — Ambulatory Visit: Payer: Self-pay

## 2014-09-12 ENCOUNTER — Other Ambulatory Visit: Payer: Self-pay | Admitting: *Deleted

## 2014-09-12 MED ORDER — ESCITALOPRAM OXALATE 10 MG PO TABS: 10.0000 mg | ORAL_TABLET | Freq: Every day | ORAL | Status: DC

## 2014-09-20 ENCOUNTER — Ambulatory Visit: Payer: BLUE CROSS/BLUE SHIELD | Admitting: Family Medicine

## 2014-09-20 ENCOUNTER — Telehealth: Payer: Self-pay | Admitting: Family Medicine

## 2014-09-20 NOTE — Telephone Encounter (Signed)
Patient did not come for their scheduled appointment today for 1 month follow up Please let me know if the patient needs to be contacted immediately for follow up or if no follow up is necessary.   ° °

## 2014-09-20 NOTE — Telephone Encounter (Signed)
Can you check on her? If she is doing perfectly, then no f/u, o/w I want to check in with her.

## 2014-09-21 NOTE — Telephone Encounter (Signed)
Spoke with patient she is doing well.  She will make an appointment if needed.

## 2014-10-16 ENCOUNTER — Encounter: Payer: Self-pay | Admitting: Family Medicine

## 2014-10-16 ENCOUNTER — Ambulatory Visit (INDEPENDENT_AMBULATORY_CARE_PROVIDER_SITE_OTHER): Payer: BLUE CROSS/BLUE SHIELD | Admitting: Family Medicine

## 2014-10-16 VITALS — BP 90/60 | HR 71 | Temp 98.7°F | Ht 65.0 in | Wt 109.8 lb

## 2014-10-16 DIAGNOSIS — E031 Congenital hypothyroidism without goiter: Secondary | ICD-10-CM | POA: Diagnosis not present

## 2014-10-16 DIAGNOSIS — N23 Unspecified renal colic: Secondary | ICD-10-CM | POA: Diagnosis not present

## 2014-10-16 DIAGNOSIS — R198 Other specified symptoms and signs involving the digestive system and abdomen: Secondary | ICD-10-CM

## 2014-10-16 DIAGNOSIS — Z79899 Other long term (current) drug therapy: Secondary | ICD-10-CM

## 2014-10-16 DIAGNOSIS — F410 Panic disorder [episodic paroxysmal anxiety] without agoraphobia: Secondary | ICD-10-CM

## 2014-10-16 DIAGNOSIS — F339 Major depressive disorder, recurrent, unspecified: Secondary | ICD-10-CM

## 2014-10-16 DIAGNOSIS — R1900 Intra-abdominal and pelvic swelling, mass and lump, unspecified site: Secondary | ICD-10-CM

## 2014-10-16 LAB — POCT URINALYSIS DIPSTICK
BILIRUBIN UA: NEGATIVE
GLUCOSE UA: NEGATIVE
Ketones, UA: NEGATIVE
LEUKOCYTES UA: NEGATIVE
Nitrite, UA: NEGATIVE
PH UA: 7
Protein, UA: NEGATIVE
RBC UA: NEGATIVE
SPEC GRAV UA: 1.02
Urobilinogen, UA: 0.2

## 2014-10-16 LAB — CBC WITH DIFFERENTIAL/PLATELET
Basophils Absolute: 0.1 10*3/uL (ref 0.0–0.1)
Basophils Relative: 0.7 % (ref 0.0–3.0)
EOS PCT: 1.5 % (ref 0.0–5.0)
Eosinophils Absolute: 0.1 10*3/uL (ref 0.0–0.7)
HCT: 39.2 % (ref 36.0–46.0)
Hemoglobin: 13 g/dL (ref 12.0–15.0)
LYMPHS ABS: 3.5 10*3/uL (ref 0.7–4.0)
LYMPHS PCT: 36.3 % (ref 12.0–46.0)
MCHC: 33.1 g/dL (ref 30.0–36.0)
MCV: 96.5 fl (ref 78.0–100.0)
MONOS PCT: 3.7 % (ref 3.0–12.0)
Monocytes Absolute: 0.4 10*3/uL (ref 0.1–1.0)
Neutro Abs: 5.5 10*3/uL (ref 1.4–7.7)
Neutrophils Relative %: 57.8 % (ref 43.0–77.0)
PLATELETS: 234 10*3/uL (ref 150.0–400.0)
RBC: 4.06 Mil/uL (ref 3.87–5.11)
RDW: 16.3 % — ABNORMAL HIGH (ref 11.5–15.5)
WBC: 9.6 10*3/uL (ref 4.0–10.5)

## 2014-10-16 LAB — BASIC METABOLIC PANEL
BUN: 14 mg/dL (ref 6–23)
CHLORIDE: 107 meq/L (ref 96–112)
CO2: 26 meq/L (ref 19–32)
CREATININE: 1.02 mg/dL (ref 0.40–1.20)
Calcium: 8.9 mg/dL (ref 8.4–10.5)
GFR: 71.58 mL/min (ref >60.00)
GLUCOSE: 87 mg/dL (ref 70–99)
Potassium: 4.6 mEq/L (ref 3.5–5.1)
Sodium: 139 mEq/L (ref 135–145)

## 2014-10-16 LAB — T3, FREE: T3 FREE: 1.3 pg/mL — AB (ref 2.3–4.2)

## 2014-10-16 LAB — HEPATIC FUNCTION PANEL
ALBUMIN: 4.1 g/dL (ref 3.5–5.2)
ALT: 25 U/L (ref 0–35)
AST: 20 U/L (ref 0–37)
Alkaline Phosphatase: 45 U/L (ref 39–117)
Bilirubin, Direct: 0 mg/dL (ref 0.0–0.3)
Total Bilirubin: 0.3 mg/dL (ref 0.2–1.2)
Total Protein: 6.5 g/dL (ref 6.0–8.3)

## 2014-10-16 LAB — TSH: TSH: 84.92 u[IU]/mL — ABNORMAL HIGH (ref 0.35–4.50)

## 2014-10-16 LAB — T4, FREE: Free T4: 0.2 ng/dL — ABNORMAL LOW (ref 0.60–1.60)

## 2014-10-16 MED ORDER — CLONAZEPAM 1 MG PO TABS: 1.0000 mg | ORAL_TABLET | Freq: Two times a day (BID) | ORAL | Status: DC

## 2014-10-16 MED ORDER — ESCITALOPRAM OXALATE 10 MG PO TABS: 10.0000 mg | ORAL_TABLET | Freq: Every day | ORAL | Status: DC

## 2014-10-16 NOTE — Progress Notes (Signed)
Pre visit review using our clinic review tool, if applicable. No additional management support is needed unless otherwise documented below in the visit note. 

## 2014-10-16 NOTE — Progress Notes (Signed)
Dr. Karleen Hampshire T. Spring San, MD, CAQ Sports Medicine Primary Care and Sports Medicine [address] [place], [address] Phone: [phone] Fax: [phone]  10/16/2014  Patient: Martha Proctor, MRN: [id number], DOB: [birth year], 24 y.o.  Primary Physician:  [name], MD  Chief Complaint: Follow-up; Medication Refill; and Kidney Issues  Subjective:   Martha Proctor is a 24 y.o. very pleasant female patient who presents with the following:  Interacting better with ex. Mom thinks taking better care ofher kids.   Took a home pregnancy test. Negative - no intercourse in 2 months.  Refill of thyroid medication.   Wt Readings from Last 3 Encounters:  10/16/14 109 lb 12 oz (49.782 kg)  08/21/14 96 lb 8 oz (43.772 kg)  07/26/14 101 lb (45.813 kg)    Eating better.  Yesterday spent whole day with Mom.   Having some minor panic attacks, but no major.  No crying.  Less irritable.  No SI or HI.  Abdomen is also distended - no clear reason.   She also is having pain in her back that she thinks is similar to a case of pyelo that she had before  Past Medical History, Surgical History, Social History, Family History, Problem List, Medications, and Allergies have been reviewed and updated if relevant.   GEN: No acute illnesses, no fevers, chills. GI: No n/v/d, eating normally Pulm: No SOB Interactive and getting along well at home.  Otherwise, ROS is as per the HPI.  Objective:   BP 90/60 mmHg  Pulse 71  Temp(Src) 98.7 F (37.1 C) (Oral)  Ht  (1.651 m)  Wt 109 lb 12 oz (49.782 kg)  BMI 18.26 kg/m2  GEN: WDWN, NAD, Non-toxic, A & O x 3 HEENT: Atraumatic, Normocephalic. Neck supple. No masses, No LAD. Ears and Nose: No external deformity. CV: RRR, No M/G/R. No JVD. No thrill. No extra heart sounds. PULM: CTA B, no wheezes, crackles, rhonchi. No retractions. No resp. distress. No accessory muscle use. ABD: S, NT, mild to moderate distension,  + BS, No rebound, No HSM  EXTR: No c/c/e NEURO Normal gait.  PSYCH: Normally interactive. Conversant. Not depressed or anxious appearing.  Calm demeanor.   Laboratory and Imaging Data: Results for orders placed or performed in visit on 10/16/14  TSH  Result Value Ref Range   TSH 84.92 (H) 0.35 - 4.50 uIU/mL  T4, free  Result Value Ref Range   Free T4 0.20 (L) 0.60 - 1.60 ng/dL  T3, free  Result Value Ref Range   T3, Free 1.3 (L) 2.3 - 4.2 pg/mL  Basic metabolic panel  Result Value Ref Range   Sodium 139 135 - 145 mEq/L   Potassium 4.6 3.5 - 5.1 mEq/L   Chloride 107 96 - 112 mEq/L   CO2 26 19 - 32 mEq/L   Glucose, Bld 87 70 - 99 mg/dL   BUN 14 6 - 23 mg/dL   Creatinine, Ser 8.65 0.40 - 1.20 mg/dL   Calcium 8.9 8.4 - 78.4 mg/dL   GFR 69.62 >95.28 mL/min  CBC with Differential/Platelet  Result Value Ref Range   WBC 9.6 4.0 - 10.5 K/uL   RBC 4.06 3.87 - 5.11 Mil/uL   Hemoglobin 13.0 12.0 - 15.0 g/dL   HCT 41.3 24.4 - 01.0 %   MCV 96.5 78.0 - 100.0 fl   MCHC 33.1 30.0 - 36.0 g/dL   RDW 27.2 (H) 53.6 - 64.4 %   Platelets 234.0 150.0 - 400.0  K/uL   Neutrophils Relative % 57.8 43.0 - 77.0 %   Lymphocytes Relative 36.3 12.0 - 46.0 %   Monocytes Relative 3.7 3.0 - 12.0 %   Eosinophils Relative 1.5 0.0 - 5.0 %   Basophils Relative 0.7 0.0 - 3.0 %   Neutro Abs 5.5 1.4 - 7.7 K/uL   Lymphs Abs 3.5 0.7 - 4.0 K/uL   Monocytes Absolute 0.4 0.1 - 1.0 K/uL   Eosinophils Absolute 0.1 0.0 - 0.7 K/uL   Basophils Absolute 0.1 0.0 - 0.1 K/uL  Hepatic function panel  Result Value Ref Range   Total Bilirubin 0.3 0.2 - 1.2 mg/dL   Bilirubin, Direct 0.0 0.0 - 0.3 mg/dL   Alkaline Phosphatase 45 39 - 117 U/L   AST 20 0 - 37 U/L   ALT 25 0 - 35 U/L   Total Protein 6.5 6.0 - 8.3 g/dL   Albumin 4.1 3.5 - 5.2 g/dL  POCT urinalysis dipstick  Result Value Ref Range   Color, UA yellow    Clarity, UA clear    Glucose, UA negative    Bilirubin, UA negative    Ketones, UA negative    Spec  Grav, UA 1.020    Blood, UA negative    pH, UA 7.0    Protein, UA negative    Urobilinogen, UA 0.2    Nitrite, UA negative    Leukocytes, UA Negative      Assessment and Plan:   Kidney pain - Plan: POCT urinalysis dipstick, Urine culture  Congenital hypothyroidism - Plan: TSH, T4, free, T3, free  Major depression, recurrent, chronic  Panic disorder without agoraphobia  Encounter for long-term current use of medication - Plan: Basic metabolic panel, CBC with Differential/Platelet, Hepatic function panel  Abdominal enlargement - Plan: US Abdomen Complete, US Pelvis Complete, Hepatic function panel  Thyroid out range, will need higher dose and f/u check.  Depression is improved and stable, keep meds same. Klonopin only if needed.  Several months of protruding abdomen with negative pregnancy test, no intercouse, no significant pain. Obtain an abdominal and pelvic ultrasound to eval GB, pancrease, liver, uterus, ovaries.   Urine cx for ? Cvat, but ua clear  Follow-up: No Follow-up on file.  New Prescriptions   No medications on file   Orders Placed This Encounter  Procedures  . Urine culture  . US Abdomen Complete  . US Pelvis Complete  . TSH  . T4, free  . T3, free  . Basic metabolic panel  . CBC with Differential/Platelet  . Hepatic function panel  . POCT urinalysis dipstick    Signed,  Karleen HampshireSpencer T. Ricki Vanhandel, MD   Patient's Medications  New Prescriptions   No medications on file  Previous Medications   LEVOTHYROXINE (SYNTHROID, LEVOTHROID) 200 MCG TABLET    Take 200 mcg by mouth daily.    Modified Medications   Modified Medication Previous Medication   CLONAZEPAM (KLONOPIN) 1 MG TABLET clonazePAM (KLONOPIN) 1 MG tablet      Take 1 tablet (1 mg total) by mouth 2 (two) times daily.    Take 1 tablet (1 mg total) by mouth 2 (two) times daily.   ESCITALOPRAM (LEXAPRO) 10 MG TABLET escitalopram (LEXAPRO) 10 MG tablet      Take 1 tablet (10 mg total) by mouth  daily.    Take 1 tablet (10 mg total) by mouth daily.  Discontinued Medications   No medications on file

## 2014-10-16 NOTE — Patient Instructions (Signed)

## 2014-10-18 ENCOUNTER — Telehealth: Payer: Self-pay | Admitting: *Deleted

## 2014-10-18 MED ORDER — LEVOTHYROXINE SODIUM 200 MCG PO TABS: 200.0000 ug | ORAL_TABLET | Freq: Every day | ORAL | Status: DC

## 2014-10-18 NOTE — Telephone Encounter (Signed)
[facility].  [facility].

## 2014-10-18 NOTE — Telephone Encounter (Signed)
[facility] that pt [facility] been banned from that store and they received a refill for Levothyroxine and request to send to different pharmacy.

## 2014-10-18 NOTE — Addendum Note (Signed)
Addended by: Damita LackLORING, DONNA S on: 10/18/2014 11:34 AM   Modules accepted: Orders

## 2014-10-18 NOTE — Telephone Encounter (Signed)
Cadience notified that Rx for her current dose of Levothyroxine 200 mcg has been [facility].  Lab appointment scheduled for 12/20/2014 at 9:30 am to repeat thyroid test.

## 2014-10-18 NOTE — Telephone Encounter (Signed)
-----   Message from Hannah BeatSpencer Copland, MD sent at 10/18/2014 10:10 AM EDT ----- Rip Harbourk, very important. Can we refill her last dose with #30, 5 refills.  We should recheck thyroid studies in a couple of months

## 2014-10-18 NOTE — Addendum Note (Signed)
Addended by: Damita LackLORING, Miriya Cloer S on: 10/18/2014 09:32 AM   Modules accepted: Orders

## 2014-10-18 NOTE — Addendum Note (Signed)
Addended by: Damita LackLORING, Olamide Lahaie S on: 10/18/2014 11:18 AM   Modules accepted: Orders

## 2014-10-19 ENCOUNTER — Telehealth: Payer: Self-pay | Admitting: *Deleted

## 2014-10-19 LAB — URINE CULTURE: Colony Count: >=100000

## 2014-10-19 MED ORDER — CIPROFLOXACIN HCL 500 MG PO TABS: 500.0000 mg | ORAL_TABLET | Freq: Two times a day (BID) | ORAL | Status: DC

## 2014-10-19 NOTE — Telephone Encounter (Signed)
-----   Message from Hannah BeatSpencer Copland, MD sent at 10/19/2014 10:31 AM EDT ----- Call:  Urine culture did come back positive with an E. Coli UTI  cipro 500 mg, 1 po bid, #14.

## 2014-10-19 NOTE — Telephone Encounter (Signed)
Sincere notified that her urine culture did show that she had an UTI.  [facility] as instructed by Dr. Patsy Lageropland.

## 2014-10-25 ENCOUNTER — Ambulatory Visit
Admit: 2014-10-25 | Disposition: A | Discharge status: Home / Self Care | Payer: Self-pay | Attending: Family Medicine | Admitting: Family Medicine

## 2014-10-25 ENCOUNTER — Emergency Department: Admit: 2014-10-25 | Disposition: A | Discharge status: Home / Self Care | Payer: Self-pay | Admitting: Student

## 2014-10-25 LAB — URINALYSIS, COMPLETE
Bilirubin,UR: NEGATIVE
Blood: NEGATIVE
Glucose,UR: NEGATIVE mg/dL (ref 0–75)
KETONE: NEGATIVE
Nitrite: NEGATIVE
PH: 5 (ref 4.5–8.0)
Protein: NEGATIVE
RBC,UR: 1 /HPF (ref 0–5)
SPECIFIC GRAVITY: 1.021 (ref 1.003–1.030)
WBC UR: 12 /HPF (ref 0–5)

## 2014-10-25 LAB — COMPREHENSIVE METABOLIC PANEL
ALK PHOS: 51 U/L
ANION GAP: 8 (ref 7–16)
Albumin: 5 g/dL
BUN: 26 mg/dL — ABNORMAL HIGH
Bilirubin,Total: 0.6 mg/dL
CALCIUM: 8.7 mg/dL — AB
CO2: 23 mmol/L
Chloride: 106 mmol/L
Creatinine: 0.86 mg/dL
EGFR (African American): >60
GLUCOSE: 117 mg/dL — AB
POTASSIUM: 3.7 mmol/L
SGOT(AST): 28 U/L
SGPT (ALT): 30 U/L
Sodium: 137 mmol/L
Total Protein: 8.2 g/dL — ABNORMAL HIGH

## 2014-10-25 LAB — CBC
HCT: 39.4 % (ref 35.0–47.0)
HGB: 13.3 g/dL (ref 12.0–16.0)
MCH: 32.9 pg (ref 26.0–34.0)
MCHC: 33.6 g/dL (ref 32.0–36.0)
MCV: 98 fL (ref 80–100)
PLATELETS: 191 10*3/uL (ref 150–440)
RBC: 4.04 10*6/uL (ref 3.80–5.20)
RDW: 15 % — ABNORMAL HIGH (ref 11.5–14.5)
WBC: 13.6 10*3/uL — ABNORMAL HIGH (ref 3.6–11.0)

## 2014-10-25 LAB — PROTIME-INR
INR: 1
Prothrombin Time: 13.2 secs

## 2014-10-25 LAB — PHOSPHORUS: Phosphorus: 3.3 mg/dL

## 2014-10-25 LAB — LACTIC ACID, PLASMA: Lactic Acid, Venous: 1.2 mmol/L

## 2014-10-25 LAB — PREGNANCY, URINE: PREGNANCY TEST, URINE: NEGATIVE m[IU]/mL

## 2014-10-25 LAB — TROPONIN I: Troponin-I: <0.03 ng/mL

## 2014-10-25 LAB — MAGNESIUM: MAGNESIUM: 1.7 mg/dL

## 2014-10-26 ENCOUNTER — Telehealth: Payer: Self-pay | Admitting: Family Medicine

## 2014-10-26 ENCOUNTER — Encounter: Payer: Self-pay | Admitting: Family Medicine

## 2014-10-26 NOTE — Telephone Encounter (Signed)
PLEASE NOTE: All timestamps contained within this report are represented as [place] Standard Time. CONFIDENTIALTY NOTICE: This fax transmission is intended only for the addressee. It contains information that is legally privileged, confidential or otherwise protected from use or disclosure. If you are not the intended recipient, you are strictly prohibited from reviewing, disclosing, copying using or disseminating any of this information or taking any action in reliance on or regarding this information. If you have received this fax in error, please notify us immediately by telephone so that we can arrange for its return to [place]. Phone: [phone], Toll-Free: [phone], Fax: [phone] Page: 1 of 2 Call Id: [id number] [facility] Day - Client TELEPHONE ADVICE RECORD [facility] Call [facility] Patient Name: Martha Proctor Gender: Female DOB: 12/21/1990 Age: 24 Y 8 M 15 D Return Phone Number: [phone] (Primary) Address: City/State/Zip: [place] [address] Client [facility] Day - Client Client Site  Primary Care [place] - Day Physician Copland, Spencer Contact Type Fax Call Type Triage / Clinical Relationship To Patient Self Appointment Disposition EMR Appointment Attempted - Not Scheduled Info pasted into Epic Yes Return Phone Number [phone] (Primary) Chief Complaint Abdominal Pain Initial Comment Caller states she is having lower abdominal pain and her BP is dropping. The right side of her kidney is swollen PreDisposition Call Doctor Nurse Assessment Nurse: Elijah Birk, RN, Stark Bray Date/Time Lamount Cohen Time): 10/26/2014 9:27:45 AM Confirm and document reason for call. If symptomatic, describe symptoms. ---Caller states she was seen at the ER last night, is having lower abdominal pain and her BP was low at the ER. 99.7 fever. The right side of her kidney area is swollen, her UTI is worse & has gotten into her  bloodstream. Prescribed an antibiotic, gave her IV antibiotics. Did a CT scan. Did not give her a prescription for pain. Has the patient traveled out of the country within the last 30 days? ---Not Applicable Does the patient require triage? ---Yes Related visit to physician within the last 2 weeks? ---Yes Does the PT have any chronic conditions? (i.e. diabetes, asthma, etc.) ---No Did the patient indicate they were pregnant? ---No Guidelines Guideline Title Affirmed Question Affirmed Notes Nurse Date/Time Lamount Cohen Time) Urinary Tract Infection on Antibiotic Follow-up Call - Female [1] Taking antibiotic < 72 hours (3 days) for UTI AND [2] flank or lower back pain not improved Elijah Birk, RN, Lynda 10/26/2014 9:32:02 AM Disp. Time Lamount Cohen Time) Disposition Final User 10/26/2014 9:37:26 AM See Physician within 24 Hours Yes Elijah Birk, RN, Stark Bray Disposition Overriden: Home Care PLEASE NOTE: All timestamps contained within this report are represented as [place] Standard Time. CONFIDENTIALTY NOTICE: This fax transmission is intended only for the addressee. It contains information that is legally privileged, confidential or otherwise protected from use or disclosure. If you are not the intended recipient, you are strictly prohibited from reviewing, disclosing, copying using or disseminating any of this information or taking any action in reliance on or regarding this information. If you have received this fax in error, please notify us immediately by telephone so that we can arrange for its return to [place]. Phone: [phone], Toll-Free: [phone], Fax: [phone] Page: 2 of 2 Call Id: [id number] Override Reason: Patient's symptoms need a higher level of care Caller Understands: Yes Disagree/Comply: Comply Care Advice Given Per Guideline SEE PHYSICIAN WITHIN 24 HOURS: * IF OFFICE WILL BE OPEN: You need to be examined within the next 24 hours. Call your doctor when the office opens, and  make an appointment.  FEVER MEDICINES: ANTIBIOTICS: Continue taking your antibiotics according to the directions the doctor gave you. FLUIDS: Drink extra fluids. Drink 8-10 glasses of liquids a day. (Reason: to produce a dilute, non-irritating urine.) * For fever relief, take acetaminophen or ibuprofen. * Treat fevers above 101 F (38.3 C). * The goal of fever therapy is to bring the fever down to a comfortable level. Remember that fever medicine usually lowers fever 2-3 F (1-1.5 C). CALL BACK IF: * You become worse. CARE ADVICE given per Urinary Tract Infection Follow-Up Call, Female (Adult) guideline. After Care Instructions Given Call Event Type User Date / Time Description Comments User: Lily LovingsLynda, Caldwell, RN Date/Time Lamount Cohen(Eastern Time): 10/26/2014 9:42:29 AM Caller states she should have stayed at the hospital, but didn't have anyone to watch her kids. Now, she has someone to watch her kids & needs to go in to an ER because they told her it is in her bloodstream now. Wants to know where to go. Nurse checked appt. schedule for today, none available & caller does not want to wait until tomorrow. Referrals [facility]

## 2014-10-26 NOTE — Telephone Encounter (Signed)
Patient Name: Martha DawleyCIARA Sanderfer DOB: 10-12-1990 Initial Comment Caller states she is having lower abdominal pain and her BP is dropping. The right side of her kidney is swollen Nurse Assessment Nurse: Elijah Birkaldwell, RN, Stark BrayLynda Date/Time (Eastern Time): 10/26/2014 9:27:45 AM Confirm and document reason for call. If symptomatic, describe symptoms. ---Caller states she was seen at the ER last night, is having lower abdominal pain and her BP was low at the ER. 99.7 fever. The right side of her kidney area is swollen, her UTI is worse & has gotten into her bloodstream. Prescribed an antibiotic, gave her IV antibiotics. Did a CT scan. Did not give her a prescription for pain. Has the patient traveled out of the country within the last 30 days? ---Not Applicable Does the patient require triage? ---Yes Related visit to physician within the last 2 weeks? ---Yes Does the PT have any chronic conditions? (i.e. diabetes, asthma, etc.) ---No Did the patient indicate they were pregnant? ---No Guidelines Guideline Title Affirmed Question Affirmed Notes Urinary Tract Infection on Antibiotic Follow-up Call - Female [1] Taking antibiotic < 72 hours (3 days) for UTI AND [2] flank or lower back pain not improved Final Disposition User See Physician within 24 Hours Chase Cityaldwell, RN, Stark BrayLynda Comments Caller states she should have stayed at the hospital, but didn't have anyone to watch her kids. Now, she has someone to watch her kids & needs to go in to an ER because they told her it is in her bloodstream now. Wants to know where to go. Nurse checked appt. schedule for today, none available & caller does not want to wait until tomorrow.

## 2014-10-26 NOTE — Telephone Encounter (Signed)
See if you can get her to come to the office right now. Otherwise, I am going to have a difficult time working her in today.  If this will not work, see if Dr. B can recheck her tomorrow.  Electronically Signed  By: Hannah BeatSpencer Tran Arzuaga, MD On: 10/26/2014 10:40 AM

## 2014-10-26 NOTE — Telephone Encounter (Signed)
Dr. Patsy Lageropland spoke with Charlynne CousinsCiara and advised her to go to [facility].

## 2014-10-27 LAB — URINE CULTURE

## 2014-10-30 LAB — CULTURE, BLOOD (SINGLE)

## 2014-11-01 ENCOUNTER — Encounter: Payer: Self-pay | Admitting: Family Medicine

## 2014-11-01 ENCOUNTER — Ambulatory Visit (INDEPENDENT_AMBULATORY_CARE_PROVIDER_SITE_OTHER): Payer: BLUE CROSS/BLUE SHIELD | Admitting: Family Medicine

## 2014-11-01 VITALS — BP 112/80 | HR 78 | Temp 98.2°F | Ht 60.5 in | Wt 104.0 lb

## 2014-11-01 DIAGNOSIS — G8929 Other chronic pain: Secondary | ICD-10-CM | POA: Diagnosis not present

## 2014-11-01 DIAGNOSIS — R1013 Epigastric pain: Secondary | ICD-10-CM

## 2014-11-01 DIAGNOSIS — R1011 Right upper quadrant pain: Secondary | ICD-10-CM | POA: Diagnosis not present

## 2014-11-01 DIAGNOSIS — R1031 Right lower quadrant pain: Secondary | ICD-10-CM | POA: Diagnosis not present

## 2014-11-01 MED ORDER — SUCRALFATE 1 G PO TABS: 1.0000 g | ORAL_TABLET | Freq: Three times a day (TID) | ORAL | Status: DC

## 2014-11-01 MED ORDER — OMEPRAZOLE 20 MG PO CPDR: 20.0000 mg | DELAYED_RELEASE_CAPSULE | Freq: Every day | ORAL | Status: DC

## 2014-11-01 NOTE — Progress Notes (Signed)
Pre visit review using our clinic review tool, if applicable. No additional management support is needed unless otherwise documented below in the visit note. 

## 2014-11-01 NOTE — Progress Notes (Signed)
Dr. Karleen Hampshire T. Bexley Laubach, MD, CAQ Sports Medicine Primary Care and Sports Medicine [address] [place], [address] Phone: [phone] Fax: [phone]  11/01/2014  Patient: Martha Proctor, MRN: [id number], DOB: 11-11-1990, 24 y.o.  Primary Physician:  [name], MD  Chief Complaint: Hospitalization Follow-up  Subjective:   Martha Proctor is a 24 y.o. very pleasant female patient who presents with the following:  Patient known almost 7 years. Recently seen with worsening depression, anxiety, which were improving and dx with acute UTI susceptible to Cipro, which she was taking. She ended up going to ED on [facility] with fever and abdominal pain. While there, here white count was 14,000, she was tachycardic and febrile, and they wanted to admit her with sepsis - per their notes.   She had a CT of abdomen and pelvis on 10/25/2014, which showed significant small bowel swelling and ileitis. She was given IV Cipro and Flagyl and left [facility] AMA to go home to care for her children. She was discharged on PO Cipro and Flagyl.   The next day, she called our office, spoke to her, and she told me that [facility] told her she had "blood poisoning," and she left the hospital AMA. They had told her to come back to the hospital if she could - I told her that was probably a good idea if she had "blood poisoning," recommended going to [facility] or [facility], and she ended up at [facility].  F/u [facility] ED, 10/25/2014 [facility] ED, 10/26/2014  All of these notes and labs and reports reviewed.  By 10/26/2014, white count had decreased to normal, she was given more IV Cipro and Flagyl, and ultimately left [facility] AMA per their notes. She has continued on her oral antibiotics, but presents with ongoing severe abdominal pain.  Minimal PO solids.  Drinking liquids ok.  Past Medical History, Surgical History, Social History, Family History, Problem List, Medications, and Allergies have  been reviewed and updated if relevant.  ROS: GEN: Acute illness details above GI: Tolerating PO intake - liquids only GU: maintaining adequate hydration and urination, mildly decreased Pulm: No SOB Interactive and getting along well at home.  Otherwise, ROS is as per the HPI.   Objective:   BP 112/80 mmHg  Pulse 78  Temp(Src) 98.2 F (36.8 C) (Oral)  Ht 5' 0.5" (1.537 m)  Wt 104 lb (47.174 kg)  BMI 19.97 kg/m2  SpO2 99%  GEN: WDWN, NAD, Non-toxic, A & O x 3 HEENT: Atraumatic, Normocephalic. Neck supple. No masses, No LAD. Ears and Nose: No external deformity. CV: RRR, No M/G/R. No JVD. No thrill. No extra heart sounds. PULM: CTA B, no wheezes, crackles, rhonchi. No retractions. No resp. distress. No accessory muscle use. ABD: S, tender throughout most of abdomen, more in epigastric region, moderate D, +BS. No rebound. No HSM. Guarding. EXTR: No c/c/e NEURO Normal gait.  PSYCH: Normally interactive. Conversant. Not depressed or anxious appearing.  Calm demeanor.     Laboratory and Imaging Data: Results for orders placed or performed in visit on 10/16/14  Urine culture  Result Value Ref Range   Culture ESCHERICHIA COLI    Colony Count >=100,000 COLONIES/ML    Organism ID, Bacteria ESCHERICHIA COLI       Susceptibility   Escherichia coli -  (no method available)    AMPICILLIN >=32 Resistant     AMOX/CLAVULANIC 4 Sensitive     AMPICILLIN/SULBACTAM 4 Sensitive     PIP/TAZO <=4  Sensitive     IMIPENEM <=0.25 Sensitive     CEFAZOLIN <=4 Sensitive     CEFTRIAXONE <=1 Sensitive     CEFTAZIDIME <=1 Sensitive     CEFEPIME <=1 Sensitive     GENTAMICIN <=1 Sensitive     TOBRAMYCIN <=1 Sensitive     CIPROFLOXACIN <=0.25 Sensitive     LEVOFLOXACIN <=0.12 Sensitive     NITROFURANTOIN <=16 Sensitive     TRIMETH/SULFA >=320 Resistant   TSH  Result Value Ref Range   TSH 84.92 (H) 0.35 - 4.50 uIU/mL  T4, free  Result Value Ref Range   Free T4 0.20 (L) 0.60 - 1.60 ng/dL  T3,  free  Result Value Ref Range   T3, Free 1.3 (L) 2.3 - 4.2 pg/mL  Basic metabolic panel  Result Value Ref Range   Sodium 139 135 - 145 mEq/L   Potassium 4.6 3.5 - 5.1 mEq/L   Chloride 107 96 - 112 mEq/L   CO2 26 19 - 32 mEq/L   Glucose, Bld 87 70 - 99 mg/dL   BUN 14 6 - 23 mg/dL   Creatinine, Ser 1.61 0.40 - 1.20 mg/dL   Calcium 8.9 8.4 - 09.6 mg/dL   GFR 04.54 >09.81 mL/min  CBC with Differential/Platelet  Result Value Ref Range   WBC 9.6 4.0 - 10.5 K/uL   RBC 4.06 3.87 - 5.11 Mil/uL   Hemoglobin 13.0 12.0 - 15.0 g/dL   HCT 19.1 47.8 - 29.5 %   MCV 96.5 78.0 - 100.0 fl   MCHC 33.1 30.0 - 36.0 g/dL   RDW 62.1 (H) 30.8 - 65.7 %   Platelets 234.0 150.0 - 400.0 K/uL   Neutrophils Relative % 57.8 43.0 - 77.0 %   Lymphocytes Relative 36.3 12.0 - 46.0 %   Monocytes Relative 3.7 3.0 - 12.0 %   Eosinophils Relative 1.5 0.0 - 5.0 %   Basophils Relative 0.7 0.0 - 3.0 %   Neutro Abs 5.5 1.4 - 7.7 K/uL   Lymphs Abs 3.5 0.7 - 4.0 K/uL   Monocytes Absolute 0.4 0.1 - 1.0 K/uL   Eosinophils Absolute 0.1 0.0 - 0.7 K/uL   Basophils Absolute 0.1 0.0 - 0.1 K/uL  Hepatic function panel  Result Value Ref Range   Total Bilirubin 0.3 0.2 - 1.2 mg/dL   Bilirubin, Direct 0.0 0.0 - 0.3 mg/dL   Alkaline Phosphatase 45 39 - 117 U/L   AST 20 0 - 37 U/L   ALT 25 0 - 35 U/L   Total Protein 6.5 6.0 - 8.3 g/dL   Albumin 4.1 3.5 - 5.2 g/dL  POCT urinalysis dipstick  Result Value Ref Range   Color, UA yellow    Clarity, UA clear    Glucose, UA negative    Bilirubin, UA negative    Ketones, UA negative    Spec Grav, UA 1.020    Blood, UA negative    pH, UA 7.0    Protein, UA negative    Urobilinogen, UA 0.2    Nitrite, UA negative    Leukocytes, UA Negative     [facility]abs and rads [facility] from [facility]  Assessment and Plan:   Abdominal pain, epigastric - Plan: CT Abdomen Pelvis W Contrast  Abdominal pain, chronic, right lower quadrant - Plan: CT Abdomen Pelvis W Contrast  Abdominal pain,  right upper quadrant - Plan: CT Abdomen Pelvis W Contrast  Acute right lower quadrant pain  Ilelitis seen on prior CT of abdomen and pelvis,  but persistent moderately severe pain entertains that her clinical picture has changed or there is additional diagnosis. Obtain a CT of the abdomen and pelvis with contrast to evaluate for colitis, diverticulitis, appendicitis, ileitis, perforated bowel, or other acute intraabdominal process. Continue cipro and flagyl.  Ulcer is possible. Stop all NSAIDS. Start carafate, PPI. Will check on patient over the next couple of days, and if all work-up relatively unremarkable, I will likely consult GI.  Follow-up: prn  New Prescriptions   OMEPRAZOLE (PRILOSEC) 20 MG CAPSULE    Take 1 capsule (20 mg total) by mouth daily.   SUCRALFATE (CARAFATE) 1 G TABLET    Take 1 tablet (1 g total) by mouth 4 (four) times daily -  with meals and at bedtime.   Orders Placed This Encounter  Procedures  . CT Abdomen Pelvis W Contrast    Signed,  Lior Hoen T. Malashia Kamaka, MD   Patient's Medications  New Prescriptions   OMEPRAZOLE (PRILOSEC) 20 MG CAPSULE    Take 1 capsule (20 mg total) by mouth daily.   SUCRALFATE (CARAFATE) 1 G TABLET    Take 1 tablet (1 g total) by mouth 4 (four) times daily -  with meals and at bedtime.  Previous Medications   CIPROFLOXACIN (CIPRO) 500 MG TABLET    Take 1 tablet (500 mg total) by mouth 2 (two) times daily.   CLONAZEPAM (KLONOPIN) 1 MG TABLET    Take 1 tablet (1 mg total) by mouth 2 (two) times daily.   ESCITALOPRAM (LEXAPRO) 10 MG TABLET    Take 1 tablet (10 mg total) by mouth daily.   LEVOTHYROXINE (SYNTHROID, LEVOTHROID) 200 MCG TABLET    Take 1 tablet (200 mcg total) by mouth daily.  Modified Medications   No medications on file  Discontinued Medications   No medications on file

## 2014-11-01 NOTE — Patient Instructions (Signed)

## 2014-11-02 ENCOUNTER — Telehealth: Payer: Self-pay | Admitting: Family Medicine

## 2014-11-02 DIAGNOSIS — R1031 Right lower quadrant pain: Secondary | ICD-10-CM

## 2014-11-02 DIAGNOSIS — K529 Noninfective gastroenteritis and colitis, unspecified: Secondary | ICD-10-CM

## 2014-11-02 DIAGNOSIS — R1013 Epigastric pain: Secondary | ICD-10-CM

## 2014-11-02 DIAGNOSIS — R1011 Right upper quadrant pain: Secondary | ICD-10-CM

## 2014-11-02 NOTE — Telephone Encounter (Signed)
Pt no showed to CT this AM.  Tried to call pt.. No answer on home line and mother answered her mobile.  Mother asked to have pt try to call to update us tommorow.  Dr. Patsy Lageropland notified and will try to reach pt again tonight.

## 2014-11-03 MED ORDER — AMOXICILLIN-POT CLAVULANATE 875-125 MG PO TABS: 1.0000 | ORAL_TABLET | Freq: Two times a day (BID) | ORAL | Status: DC

## 2014-11-03 NOTE — Telephone Encounter (Signed)
Martha Proctor notified as instructed by telephone.  She wanted to let Dr. Patsy Lageropland know that her mom is taking vacation time to take care of the kids so she can get better.

## 2014-11-03 NOTE — Telephone Encounter (Signed)
I have not been able to reach this patient. Her phone does not have voicemail / answering machine. The only other number that I can find is for her mother's cell phone, and she is not answering. My partner left a message last night on the patient's mother's phone and asked her to let us know how the patient is doing.   Electronically Signed  By: Hannah BeatSpencer Roemello Speyer, MD On: 11/03/2014 8:38 AM

## 2014-11-03 NOTE — Addendum Note (Signed)
Addended by: Hannah BeatOPLAND, Jermond Burkemper on: 11/03/2014 04:11 PM   Modules accepted: Orders, Medications

## 2014-11-03 NOTE — Telephone Encounter (Signed)
Please let her know:  I have called Dr. Annabell SabalWohl's office in [place] to arrange consultation on Monday for her abdomen.  He is a GI and works at [employer] in [place].  They are in [address] in [place] in [place] [employer] [facility] [place].   Appointment is Monday at 2:45 PM with Dr. Servando SnareWohl.   Electronically Signed  By: Hannah BeatSpencer Sulamita Lafountain, MD On: 11/03/2014 4:28 PM

## 2014-11-03 NOTE — Addendum Note (Signed)
Addended by: Hannah BeatOPLAND, Jaylee Freeze on: 11/03/2014 04:28 PM   Modules accepted: Orders

## 2014-11-03 NOTE — Telephone Encounter (Signed)
I was able to speak with the patient.  She is stable, but not really improved.  Has been taking the carafate and PPI that I gave her, but she seemed to think that I wanted her to stop her cipro/flagyl. (I did not)  Given 10 days of mod-severe abdominal pain, now location seems mostly epigastric, ulcer possible. I would like GI to see her.   Change to Augmentin 875 mg po bid x 7 days.  Electronically Signed  By: Hannah BeatSpencer Gracin Mcpartland, MD On: 11/03/2014 3:47 PM

## 2014-11-09 ENCOUNTER — Other Ambulatory Visit: Payer: Self-pay | Admitting: *Deleted

## 2014-11-09 MED ORDER — LEVOTHYROXINE SODIUM 200 MCG PO TABS: 200.0000 ug | ORAL_TABLET | Freq: Every day | ORAL | Status: DC

## 2014-11-13 ENCOUNTER — Telehealth: Payer: Self-pay | Admitting: Family Medicine

## 2014-11-13 NOTE — Telephone Encounter (Signed)
Patient rescued a dog that was beaten.  Patient said she has anxiety.  Patient's apartment manager left a note that if patient gets a note from her doctor, she can keep the dog.  The apartment manager told her the only way she can keep the dog in the apartment is for the note to state that patient has anxiety.  Patient said the dog has helped her when her children are with their Dad, so she's not alone.  Patient needs the note as soon as possible or she'll lose the dog.

## 2014-11-13 NOTE — Telephone Encounter (Signed)
Lupita LeashDonna, can you help note and i will sign on wed.

## 2014-11-14 ENCOUNTER — Encounter: Payer: Self-pay | Admitting: *Deleted

## 2014-11-14 NOTE — Telephone Encounter (Signed)
This looks good.

## 2014-11-14 NOTE — Telephone Encounter (Signed)
Martha Proctor notified letter is ready to be picked up at the front desk.

## 2014-11-14 NOTE — Telephone Encounter (Signed)
Letter written and routed to Dr. Copland for review. 

## 2014-11-17 NOTE — Consult Note (Signed)
PATIENT NAME:  Martha Proctor, Martha Proctor MR#:  [id number] DATE OF BIRTH:  [birth year]  DATE OF CONSULTATION:  07/29/2012  REFERRING PHYSICIAN: Marcelino DusterJohn Johnston, MD  CONSULTING PHYSICIAN:  A. Wendall MolaMelissa Mikaylah Libbey, MD  CHIEF COMPLAINT: Hypothyroidism with overdose of levothyroxine.   HISTORY OF PRESENT ILLNESS: This is a 24 year old female with a history of congenital hypothyroidism, who was admitted yesterday after consuming about 30 pills of levothyroxine 125 mcg. The patient reports she was feeling badly and turned to the pills for relief. She had been previously noncompliant with levothyroxine for about 5 months. She reports she had been feeling depressed. She denies she was trying to hurt herself. She denies prior suicide attempts. She does report worsening depression in recent weeks. She has a long history of noncompliance with levothyroxine. Her last TSH in June was 111. Her current TSH is 76. She states she feels "hyper". She describes this as difficulty sleeping. She denies palpitations. She denies heart racing. She denies tremors. She does report recent weight loss in the last few weeks, amount not known.   PAST MEDICAL HISTORY:  1.  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  2.  Tobacco dependence.  3.  Genital HSV.   PAST SURGICAL HISTORY: None.   ALLERGIES: No known drug allergies.   OUTPATIENT MEDICATIONS: Levothyroxine 125 mcg 3 tabs daily.   SOCIAL HISTORY: The patient smokes cigarettes about 1/2 pack per day. Occasional alcohol use. She has one son, age 23 months.   FAMILY HISTORY: No known thyroid disease.  REVIEW OF SYSTEMS:  GENERAL: Weight loss as per history of present illness. Denies fatigue.  HEENT: Denies blurred vision. Denies sore throat.  NECK: Denies neck pain or dysphagia.  CARDIAC: Denies chest pain or palpitations.  PULMONARY: Denies cough or shortness of breath.  ABDOMEN: Denies abdominal pain. Reports good appetite. Denies nausea.  SKIN: Denies recent rash or  pruritus.  EXTREMITIES: Denies leg swelling. Denies muscular weakness.  PSYCHIATRIC: Again. Admits to depression.   PHYSICAL EXAMINATION:  VITAL SIGNS: Height 60 inches, weight 98 pounds, BMI 19, temperature 98.7, pulse 46 to 56, respirations 18 to 20, blood pressure 112/72, pulse oximetry 97% on room air.  GENERAL: Thin, disheveled, white female in no distress.  HEENT: EOMI. Oropharynx is clear. Mucous membranes moist.  NECK: Supple. No palpable thyroid tissue.  LYMPH: No submandibular or anterior cervical lymphadenopathy.  CARDIAC: Bradycardia without murmur.  PULMONARY: Clear to auscultation bilaterally. No wheeze. No rhonchi. Good inspiratory effort.  ABDOMEN: Diffusely soft, nontender. Normal abdominal bowel sounds.  EXTREMITIES: No edema is present.  SKIN: No dermatopathy or rash is noted.  NEUROLOGIC: No tremor is present. Normal motor tone.  PSYCHIATRIC: Somewhat upset, tearful at times, cooperative.   LABORATORIES: On 07/28/2012, glucose 167, BUN 15, creatinine 1.09, sodium 140, potassium 3.5. Alcohol less than 0.003. Albumin 4.6, AST 25, ALT 21. TSH 76.1. Urine drug screen was positive for benzodiazepines and cannabinoids. Hematocrit 40.5%, WBC 11.9, platelets 228. Urinalysis showed leukocyte esterase and leukocytes, 3+ bacteria and greater than 100,000 CFU of gram-negative rods, ID pending. Salicylates were elevated at 4.4.   ASSESSMENT: A 24 year old female with congenital hypothyroidism due to thyroid dysgenesis, admitted yesterday after intentional overdose potentially  of multiple drugs to include levothyroxine. She did have positive cannabinoids, positive benzodiazepines and elevated salicylate level.Marland Kitchen.  RECOMMENDATION: 1.  Levothyroxine overdose: Again, she has congenital hypothyroidism, which has long been untreated. She is at low risk for inducing hyperthyroidism with her recent open her dose of levothyroxine due to her severe  hypothyroidism at baseline. She also had several  bouts of emesis last night, so I doubt whether the estimated 30 pills were truly observed. No need for use of beta blockers or other treatment empirically to prevent symptoms hyperthyroidism at this point. We will plan to obtain a T4 level tomorrow. I anticipate that with the half life of levothyroxine of 6 to 7 days, that we can restart scheduled daily levothyroxine in less than 1 week or perhaps sooner based on her thyroid labs.  2.  Bradycardia: The cause may be related to her drug overdose. Of note, she did not have bradycardia during clinic visits with me over the last 12 months, so I suspect that this is a new problem. Hopefully it should normalize without intervention.  3.  Depression: I agree with plan to transfer to Behavioral Medicine once medically stable. From my standpoint, she could be transferred at any time.   Thank you for the kind request for consultation. I will order a FT4 for tomorrow morning and follow up with the team.     ____________________________ A. Wendall Mola, MD ams:aw D: 07/29/2012 13:57:46 ET T: 07/29/2012 14:16:44 ET JOB#: [id number]  cc: A. Wendall Mola, MD, <Dictator> Macy Mis MD ELECTRONICALLY SIGNED 07/30/2012 16:15

## 2014-11-17 NOTE — H&P (Signed)
PATIENT NAME:  Martha Proctor, Martha Proctor MR#:  [id number] DATE OF BIRTH:  Jan 05, 1991  DATE OF ADMISSION:  07/28/2012  CHIEF COMPLAINT:  Drug overdose.   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 She is on large doses of Synthroid at 375 mcg a day. She has been fairly noncompliant with it, on and off. Today around 1:30 p.m., she took around 30 to 40 pills of her 125 mcg tablets in an attempt to try to hurt herself; this was about of 2 to 3 hours ago. Currently, she feels a little bit jittery, but no other symptoms. She sees Dr. Tedd SiasSolum as an outpatient, but apparently has been noncompliant with visits.   PAST MEDICAL HISTORY:  Athyroid.   PAST SURGICAL HISTORY:  None.   ALLERGIES:  No known drug allergies.   CURRENT MEDICATIONS:  Synthroid 375 mcg daily.   SOCIAL HISTORY:  Smokes about a half-pack of cigarettes a day. Drinks alcohol occasionally and does not do any drugs.   FAMILY HISTORY:  Significant for cancer.   REVIEW OF SYSTEMS:  CONSTITUTIONAL: No fever or chills.  EYES: No blurred vision.  ENT: No hearing loss.  CARDIOVASCULAR: No chest pain.  PULMONARY: No shortness of breath.  GASTROINTESTINAL: No nausea, vomiting or diarrhea.  GENITOURINARY: No dysuria.  ENDOCRINE: No heat or cold intolerance.  INTEGUMENT: No rash.  MUSCULOSKELETAL: Occasional joint pain.  NEUROLOGIC: No numbness or weakness.   PHYSICAL EXAMINATION: VITAL SIGNS: Temperature is 98.9, pulse 92, respiration 18, blood pressure 129/94.  GENERAL: This is a well-nourished, thin-looking, white female who is nervous.  HEENT: The pupils are equal, round, reactive to light. Sclerae are anicteric. The oral mucosa is moist. Oropharynx is clear. Nasopharynx is clear.  NECK: Supple. No JVD, lymphadenopathy or thyromegaly.  CARDIOVASCULAR: Regular rate and rhythm. No murmurs, rubs or gallops.  LUNGS: Clear to auscultation. No dullness to  percussion. She is not using accessory muscles.  ABDOMEN: Soft, nontender, nondistended. Bowel sounds are positive. No hepatosplenomegaly. No masses.  EXTREMITIES: There is no edema.  NEUROLOGIC: Cranial nerves II through XII are intact. She is alert and oriented x 4. Reflexes are hypoactive.  SKIN: Moist with no rash.   LABORATORY DATA:  BUN is 15, creatinine is 1.09. White blood cells 11.9. TSH is still pending.   ASSESSMENT AND PLAN:   1.  Drug overdose. The patient took an intentional overdose of her Synthroid; still waiting for a thyroid function to be resulted. Her last TSH back in June was 111. She has been fairly noncompliant on her medications. We will observe her overnight on telemetry, look for signs of acute hyperthyroidism or thyroid storm and treat with beta-blockers if necessary. We will also consult endocrinology.  2.  Hypothyroidism. We will go ahead and hold her thyroid medications for now. We will consult her endocrinologist for further followup.  3.  A potential suicide attempt. Behavioral health has already seen her and has her under commitment. She will likely be transferred there after 24 hours.   TIME SPENT ON ADMISSION:  45 minutes.    ____________________________ Gracelyn NurseJohn D. Lexany Belknap, MD jdj:si D: 07/28/2012 16:12:00 ET T: 07/28/2012 16:50:24 ET JOB#: [id number]  cc: Gracelyn NurseJohn D. Isobel Eisenhuth, MD, <Dictator> A. Wendall MolaMelissa Solum, MD Eustaquio BoydenJavier Gutierrez, MD     Gracelyn NurseJOHN D Debie Ashline MD ELECTRONICALLY SIGNED 07/29/2012 20:00

## 2014-11-17 NOTE — Consult Note (Signed)
Details:    - Psychiatry: Patient seen. Major depression. Suicide attempt yesterday. IVC. [facility] intake nurse notified. Will try to reach Dr Luberta MutterKonidena to let her know patient is ready and once DC orders are processed I can put in admit orders for psych.   Electronic Signatures: Audery Amellapacs, Kaeley Vinje T (MD)  (Signed 03-Jan-14 12:52)  Authored: Details   Last Updated: 03-Jan-14 12:52 by Audery Amellapacs, Matty Deamer T (MD)

## 2014-11-17 NOTE — Discharge Summary (Signed)
PATIENT NAME:  Martha PortelaSTACKHOUSE, Martha N MR#:  [id number] DATE OF BIRTH:  10-15-1990  DATE OF ADMISSION:  07/30/2012 DATE OF DISCHARGE:    HOSPITAL COURSE: See dictated history and physical for details of admission. This 24 year old woman was admitted, a transfer from the medical service. She had taken an overdose of her thyroid medication as an intentional suicide attempt. She did not suffer any lasting harm from thyroid overdose and has been seen by endocrinology in the hospital and has a current appropriate plan for managing her thyroid condition. Her mood was initially depressed and irritable. She has been treated with an antidepressant and is currently taking mirtazapine 30 mg at night. She has tolerated this well. Mood has improved during her hospital stay. She has participated appropriately in groups. She is expressing better insight. Affect is upbeat and bright. Totally denies any suicidal ideation. She agrees to outpatient mental health treatment. She has been engaged in individual therapy and educated about major depression and the importance of staying in active treatment.   DISCHARGE MEDICATIONS: 1.  Mirtazapine 30 mg p.o. at bedtime.  2.  Vistaril 50 mg p.o. at bedtime.  3.  Macrodantin 100 mg p.o. every 6 hours for 7 more days.  4.  Synthroid 200 mcg per day.   LABORATORY RESULTS:  Free thyroxine 1.14.  No other labs done during this hospitalization; see prior hospitalization for labs before that.   MENTAL STATUS EXAM AT DISCHARGE:  Casually dressed girl, looks her stated age or younger. Cooperative with the exam. Good eye contact. Normal psychomotor activity. Speech normal rate, tone and volume. Affect smiling, upbeat, euthymic, appropriate. Mood stated as being better. Thoughts are lucid without any loosening of associations or delusions. Denies auditory or visual hallucinations. Denies suicidal or homicidal ideation. Shows improved insight and judgment. Baseline intelligence is normal, alert  and oriented x 4.   The patient is discharged home with follow-up to be arranged through [facility]. It transpired that she allowed her Medicaid to lapse. She will be given some advice about restarting Medicaid coverage for herself and her child.   DIAGNOSIS, PRINCIPAL AND PRIMARY: AXIS I: Major depression, single episode, severe.   SECONDARY DIAGNOSES: AXIS I: No further.  AXIS II: Deferred.  AXIS III: Congenital hypothyroidism.                   Urinary tract infection.  AXIS IV: Moderate to severe from being a single mother, ongoing burden of illness, financial difficulties, lack of resources.  AXIS V: Functioning at time of discharge 55.    ____________________________ Audery AmelJohn T. Terion Hedman, MD jtc:ct D: 08/02/2012 12:05:53 ET T: 08/02/2012 12:17:39 ET JOB#: [id number]  cc: Audery AmelJohn T. Fawn Desrocher, MD, <Dictator> Audery AmelJOHN T Tranise Forrest MD ELECTRONICALLY SIGNED 08/02/2012 16:59

## 2014-11-17 NOTE — Discharge Summary (Signed)
PATIENT NAME:  Martha Proctor, Martha Proctor MR#:  [id number] DATE OF BIRTH:  1991-07-28  DATE OF ADMISSION:  07/28/2012 DATE OF DISCHARGE:  07/30/2012  DISCHARGE DIAGNOSES: 1. Levothyroxine overdose with suicidal attempt.  2. Depression.  3. Bradycardia, which is asymptomatic.  4. Chronic asymptomatic hypotension. 5. Urinary tract infection.   DISCHARGE MEDICATIONS: 1. Seroquel 50 mg p.o. daily.  2. Ativan 1 mg p.o. every 4 hours as needed.  3. Cipro 500 mg p.o. 2 times a day for 5 days.   DISPOSITION: The patient will be transferred to behavioral health unit when the bed is available.  CONSULTANTS:  1. Brandy HaleFaheem Uzma, MD - Psychiatry  2. Wendall MolaMelissa Solum, MD - Endocrinology.   HOSPITAL COURSE: The patient is a 24 year old female patient with hypothyroidism born with congenital hyperthyroidism, on large doses of Synthroid 375 mcg, admitted because of levothyroxine overdose in an attempt to hurt herself. The patient was admitted to hospitalist service for overdose on thyroid medication and admitted to the medical unit, made IVC one-to-one, and the patient was monitored clinically. The patient was monitored for hyperthyroidism and signs of thyrotoxicosis with heart rate and also confusion, but she has never had confusion and heart rate is stable. It was around 50 to 48. [facility]. They recommended to monitor T4 and TSH. The patient received 50 grams of charcoal in the Emergency Room, which as probably like within an hour of ingestion. Since admission she felt depressed but did not have suicidal ideation. She was seen by a psychiatrist. She suggested transfer to behavioral health unit. The patient was started on Seroquel yesterday, 50 mg at night, and Ativan as needed. Today she says Seroquel helped her with her mood and also calmed her down and she feels better today and she is really wanting to go home, but she will be going to the psych unit before the decision can be made.  She is on IVC along with one-to-one.  Regarding her hyperthyroidism and on large doses of Synthroid that she takes, she was seen by Dr. Tedd SiasSolum. The patient has a history of noncompliance and restarted levothyroxine about 5 months ago. The patient's TSH here is 76, but TSH in June was 111. Dr. Tedd SiasSolum recommended she can probably restart levothyroxine in probably a week. The patient's 4 and TSH labs are drawn and the patient's T4 today shows 10.2, T3 uptake is 31% and TSH is 70. The patient will see Dr. Tedd SiasSolum in the clinic probably by Monday. The patient's other labs are significant for urine showing UTI with Escherichia coli sensitive to nitrofurantoins and Cipro. We gave her Rocephin here for 2 days and she can continue Cipro for 5 days. She will be going to the behavioral health unit when a bed is available. Vitals today: heart is 61, blood pressure 107/69 and saturation 99% on room air. Heart rate yesterday was around 54 and blood pressure also was low around 96/58. We started her on IV fluids at 80 mL/h. Blood pressure is better and she says her blood pressure is always low and she does not feel any symptoms because of low heart rate and she does not feel dizzy. So she may be having a symptomatic bradycardia at this time, but she can be monitored.  TIME SPENT ON DISCHARGE PREPARATION: More than 30 minutes.  ____________________________ Katha HammingSnehalatha Eitan Doubleday, MD sk:sb D: 07/30/2012 11:41:25 ET T: 07/30/2012 12:02:08 ET JOB#: [id number]  cc: Katha HammingSnehalatha Vola Beneke, MD, <Dictator> Katha HammingSNEHALATHA Trase Bunda MD ELECTRONICALLY SIGNED 08/23/2012 9:04

## 2014-11-17 NOTE — Consult Note (Signed)
Chief Complaint:   Chief Complaint Admitted after she attempted suicide by over dosing on her thyroid meds.   Presenting Symptoms:   Presenting Symptoms Anger/irritability  Sleep Disturbance  Mood Disturbance  Suicidal Ideation   History of Present Illness:   History of Present Illness This is a 24 year old female who has a history of hypothyroidism due to being born without a thyroid. She is on large doses of Synthroid at 375 mcg a day. She has been fairly noncompliant with it, on and off. Around  1:30 p.m in the presence of her baby's father, she took around 30 to 40 pills of her 125 mcg tablets in an attempt to try to hurt herself. Her mother called 14 and she was brought to the hospital for admission. She follows Dr. Gabriel Carina as an outpatient, but apparently has been noncompliant with visits.  During my interview pt remains with poor insight. She stated that she does not see the reason to keep on taking her Thyroid meds. However, since has taken overdose, she has been feeling better. She has long h/o mood swings, agitation, anger impulsivity and thoughts about hurting herself. However, she has never tried to hurt herself in the past. She stated that she did this attempt in the presence of her boyfriend when, he was visiting her son as she was having a conflict a with him. She also feels that her 60 yo son is not attached to her and he is more close to his father although he lives with her.  She denied any use of drugs or alcohol. She smokes cigarettes regularly. She is currently not taking any psychotropic meds.    History of Present Illness (continue) Pt did not allow me to call her mother Jenny Reichmann [phone]. She became irate as we were discussing her treatment plan.   Precipitating Event & Recent Stressors:   Precipitating Event & Recent Stressors Relationship Problems    Precipitating Event & Recent Stressors Has poor relationship with mother and her son.   Target Symptoms:   Depressive  Sleep Change  Depressed Mood    Manic Pressured Speech  Impaired Judgment  Impulsivity  Aggression  unable to control her anger.    Behavior Agitation  Aggression    Arousal/Cognitive Agitation    Capacity Recognizes the presence of illlness  Has poor understanding of her illness and the consequences of treatment  refusal.   Past Psychiatric Treatment: First Treatment: She was prescribed Prozac after her son was born but she did not take it. She took Zoloft for 2 months but it made her too sleepy. She has never tried a mood stabilizer.   Previous Hospitalizations: None.   History of Suicide Attempts: Denied.   Current Outpatient Treatment: None.   Substance Abuse Treatment History: Uses Nicotine.   Current Psychotropic Medications: None.  Substance Abuse- Alcohol: The patient denies any use of alcohol..  Substance Abuse- Opiates: The patient denies any current abuse of opiates or history of opiate abuse..  Substance Abuse- Cannabis: The patient denies any cannabis use..  Substance Abuse- Tobacco Use: Tobacco Use: Yes.  Past Medical & Surgical History:   Past Medical/Surgical Hx H/O thyroid problem.   PAST MEDICAL & SURGICAL HX:  Significant Events:   Born without a Thyroid:   CURRENT OUTPATIENT MEDICATIONS:  Home Medications: Medication Instructions Status  levothyroxine 125 mcg (0.125 mg) oral tablet 3 tab(s) orally once a day Active   Family History: Reported h/o depression and anxiety in family.  Social History: Lives with  her 63 yo son. Mother is supportive.  History of Trauma or Abuse: The patient denies any history of previous physical or sexual abuse..  Mental Status Exam:   Mental Status Exam Thinly built female lying in bed. Maintained fair eye contact.    Speech Pressured    Mood Irritable  Anxious    Affect Depressed  Labile    Thought Processes Circumstantiality    Orientation Self  Place    Attention Alert    Concentration Fair     Memory Intact    Fund of Knowledge Fair    Language Fair    Judgement Poor, about her illness and reason for admission    Insight Poor, about her illness and reason for admission    Reliabiity Fair   Suicide Risk Assessment: Suicide Risk Level Significant current risk.  Review of Systems:  Review of Systems:   Fever/Chills No    Cough No    Sputum No    Abdominal Pain No    Diarrhea No    Constipation No    Nausea/Vomiting No    SOB/DOE No    Chest Pain No    Dysuria No    Tolerating PT Yes    Tolerating Diet Yes    Medications/Allergies Reviewed Medications/Allergies reviewed   NURSING FLOWSHEETS:  Vital Signs/Nurse Notes-CM: ED Vital Sign Flow Sheet:   01-Jan-14 13:50   Temp Temperature 98.9   Temp Source oral   Pulse Pulse 92   Respirations Respirations 18   SBP SBP 129   DBP DBP 94   Pulse Ox % Pulse Ox % 97   Pulse Ox Source Source Room Air   Pain Scale (0-10) Pain Scale (0-10) Scale:0  Transfer Form (Outpatient in Facility):   02-Jan-14 05:35   Telemetry pattern Cardiac Rhythm pattern reported by Telemetry Clerk; heart rate 46 sinus arirthymia    09:22   Telemetry pattern Cardiac Rhythm Bradycardia; pattern reported by Telemetry Clerk; HR 56  **Vital Signs.:   01-Jan-14 17:16   Vital Signs Type Admission   Temperature Temperature (F) 98.1   Celsius 36.7   Temperature Source Oral   Pulse Pulse 58   Respirations Respirations 20   Systolic BP Systolic BP 378   Diastolic BP (mmHg) Diastolic BP (mmHg) 69   Mean BP 83   Pulse Ox % Pulse Ox % 99   Pulse Ox Activity Level  At rest   Oxygen Delivery Room Air/ 21 %    18:10   Vital Signs Type Telemetry   Pulse Pulse 70   Pulse source if not from Vital Sign Device per Telemetry Clerk   Telemetry pattern Cardiac Rhythm Normal sinus rhythm; pattern reported by Telemetry Clerk    19:49   Pulse Pulse 48   Pulse source if not from Vital Sign Device per Telemetry Clerk    20:06   Pulse Pulse  58   Pulse source if not from Vital Sign Device per Telemetry Clerk    21:13   Vital Signs Type Q 4hr   Temperature Temperature (F) 98   Celsius 36.6   Temperature Source Oral   Pulse Pulse 48   Respirations Respirations 18   Systolic BP Systolic BP 96   Diastolic BP (mmHg) Diastolic BP (mmHg) 58   Mean BP 70   Pulse Ox % Pulse Ox % 98   Pulse Ox Activity Level  At rest   Oxygen Delivery Room Air/ 21 %    02-Jan-14 01:00  Vital Signs Type Q 4hr   Temperature Temperature (F) 98.1   Celsius 36.7   Temperature Source Oral   Pulse Pulse 47   Respirations Respirations 18   Systolic BP Systolic BP 93   Diastolic BP (mmHg) Diastolic BP (mmHg) 56   Mean BP 68   Pulse Ox % Pulse Ox % 96   Pulse Ox Activity Level  At rest   Oxygen Delivery Room Air/ 21 %    05:35   Telemetry pattern Cardiac Rhythm pattern reported by Telemetry Clerk; heart rate 46 sinus arirthymia    05:54   Vital Signs Type Q 4hr   Temperature Temperature (F) 98.2   Celsius 36.7   Temperature Source Oral   Pulse Pulse 56   Respirations Respirations 18   Systolic BP Systolic BP 91   Diastolic BP (mmHg) Diastolic BP (mmHg) 52   Mean BP 65   Pulse Ox % Pulse Ox % 97   Pulse Ox Activity Level  At rest   Oxygen Delivery Room Air/ 21 %    09:20   Vital Signs Type Q 4hr   Temperature Temperature (F) 98.1   Celsius 36.7   Temperature Source Oral   Pulse Pulse 54   Pulse source if not from Vital Sign Device per Telemetry Clerk   Respirations Respirations 18   Systolic BP Systolic BP 500   Diastolic BP (mmHg) Diastolic BP (mmHg) 58   Mean BP 74   Pulse Ox % Pulse Ox % 98   Pulse Ox Activity Level  At rest   Oxygen Delivery Room Air/ 21 %   Telemetry pattern Cardiac Rhythm pattern reported by Telemetry Clerk; heart rate 46 sinus arirthymia    09:22   Telemetry pattern Cardiac Rhythm Bradycardia; pattern reported by Telemetry Clerk; HR 56    11:42   Vital Signs Type Routine   Temperature Temperature (F) 98.7    Celsius 37   Temperature Source oral   Pulse Pulse 46   Respirations Respirations 20   Systolic BP Systolic BP 938   Diastolic BP (mmHg) Diastolic BP (mmHg) 72   Mean BP 85   Pulse Ox % Pulse Ox % 97   Pulse Ox Activity Level  At rest   Oxygen Delivery Room Air/ 21 %  Vital Signs/Nurse Notes-CM: ED Behavioral Health Assessment:   01-Jan-14 14:00   Cognitive Assessment Alert & Oriented   Behavior Appropriate to stimulations   Nutrition and fluids offered? Yes    16:00   Cognitive Assessment Alert & Oriented   Behavior Appropriate to stimulations   Nutrition and fluids offered? Yes   LAB:  Laboratory Results: Thyroid:    02-Jan-14 01:39, Thyroid Stimulating Hormone   Thyroid Stimulating Hormone 76.1   0.45-4.50  (International Unit)   -----------------------  Pregnant patients have   different reference   ranges for TSH:   - - - - - - - - - -   Pregnant, first trimetser:   0.36 - 2.50 uIU/mL  Hepatic:    01-Jan-14 14:20, Comprehensive Metabolic Panel   Bilirubin, Total 0.4   Alkaline Phosphatase 71   SGPT (ALT) 21   SGOT (AST) 25   Total Protein, Serum 8.3   Albumin, Serum 4.6  Routine Micro:    01-Jan-14 15:48, Urine Culture   Organism Name    GRAM NEGATIVE ROD   Organism Quantity    >100,000 CFU/ML   Micro Text Report    URINE CULTURE    ORGANISM 1                >  100,000 CFU/ML GRAM NEGATIVE ROD    COMMENT                   ID TO FOLLOW SENSITIVITIES TO FOLLOW     ANTIBIOTIC   Specimen Source    CLEAN CATCH URINE   Organism 1    >100,000 CFU/ML GRAM NEGATIVE ROD   Culture Comment    ID TO FOLLOW SENSITIVITIES TO FOLLOW   Result(s) reported on 29 Jul 2012 at 10:47AM.  General Ref:    01-Jan-14 14:20, Acetaminophen, Serum   Acetaminophen, Serum < 2   10-30  POTENTIALLY TOXIC:   > 200 mcg/mL   > 50 mcg/mL at 12 hr after   ingestion   > 300 mcg/mL at 4 hr after   ingestion    [id number]-[id number] 56:31, Salicylates, Serum   Salicylates, Serum 4.4    0.0-2.8  Therapeutic 2.8-20.0 mg/dL  Toxic >30.0 mg/dL  Routine Chem:    01-Jan-14 14:20, Comprehensive Metabolic Panel   Glucose, Serum 167   BUN 15   Creatinine (comp) 1.09   Sodium, Serum 140   Potassium, Serum 3.5   Chloride, Serum 106   CO2, Serum 25   Calcium (Total), Serum 8.6   Osmolality (calc) 284   eGFR (African American) >60   eGFR (Non-African American) >60   eGFR values <69m/min/1.73 m2 may be an indication of chronic  kidney disease (CKD).  Calculated eGFR is useful in patients with stable renal function.  The eGFR calculation will not be reliable in acutely ill patients  when serum creatinine is changing rapidly. It is not useful in   patients on dialysis. The eGFR calculation may not be applicable  to patients at the low and high extremes of body sizes, pregnant  women, and vegetarians.   Anion Gap 9    01-Jan-14 14:20, Ethanol, Serum   Ethanol, S. < 3   Ethanol % (comp) < 0.003   Result(s) reported on 28 Jul 2012 at 02:46PM.  Urine Drugs:    01-Jan-14 15:48, Urine Drug Screen, Qual   Tricyclic Antidepressant, Ur Qual (comp) NEGATIVE   Result(s) reported on 28 Jul 2012 at 04:33PM.   Amphetamines, Urine Qual. NEGATIVE   MDMA, Urine Qual. NEGATIVE   Cocaine Metabolite, Urine Qual. NEGATIVE   Opiate, Urine qual NEGATIVE   Phencyclidine, Urine Qual. NEGATIVE   Cannabinoid, Urine Qual. POSITIVE   Barbiturates, Urine Qual. NEGATIVE   Benzodiazepine, Urine Qual. POSITIVE   -----------------  The URINE DRUG SCREEN provides only a preliminary, unconfirmed  analytical test result and should not be used for non-medical   purposes.  Clinical consideration and professional judgment should be   applied to any positive drug screen result due to possible  interfering substances.  A more specific alternate chemical method  must be used in order to obtain a confirmed analytical result.  Gas  chromatography/mass spectrometry (GC/MS) is the preferred  confirmatory  method.   Methadone, Urine Qual. NEGATIVE  Routine UA:    01-Jan-14 15:48, Urinalysis   Color (UA) Yellow   Clarity (UA) Cloudy   Glucose (UA) Negative   Bilirubin (UA) Negative   Ketones (UA) Negative   Specific Gravity (UA) 1.009   Blood (UA) 1+   pH (UA) 7.0   Protein (UA) 30 mg/dL   Nitrite (UA) Positive   Leukocyte Esterase (UA) 3+   Result(s) reported on 28 Jul 2012 at 04:20PM.   RBC (UA) 3 /HPF   WBC (UA) 45 /HPF   Bacteria (  UA) 3+   Epithelial Cells (UA) 27 /HPF   WBC Clump (UA) PRESENT   Mucous (UA) PRESENT   Budding Yeast (UA) PRESENT   Result(s) reported on 28 Jul 2012 at 04:20PM.  Routine Hem:    01-Jan-14 14:20, Hemogram, Platelet Count   WBC (CBC) 11.9   RBC (CBC) 4.05   Hemoglobin (CBC) 13.5   Hematocrit (CBC) 40.5   Platelet Count (CBC) 228   Result(s) reported on 28 Jul 2012 at 02:40PM.   MCV 100   MCH 33.4   MCHC 33.5   RDW 14.0   OTHER RESULTS:  Radiology Results: [place]:    21-Sep-13 16:39, US Kidney Bilateral   US Kidney Bilateral   REASON FOR EXAM:    elevated renal functions dysuria  COMMENTS:       PROCEDURE: [place]  - US KIDNEY  - Apr 17 2012  4:39PM     RESULT: Renal sonogram shows the right kidney measures 9.6 by or 0.97 x   5.32 cm. The left kidney measures 10.3 x 5.1 x 4.9 cm. There is a minimal   amount of urine in the bladder. The bladder wall appears thickened.   Ureteral jets are noted bilaterally. There does not appear to be definite   hydronephrosis. Renal cortex appears to be somewhat echogenic bilaterally.    IMPRESSION:   1. No solid or cystic renal mass. Increased echogenicity to the renal   cortex bilaterally. Nondistended urinary bladder with a thickened wall.   This could be artifactual but could be secondary to a neurogenic bladder     or cystitis.    Dictation Site: 6          Verified By: Sundra Aland, M.D., MD  CT:    06-Jul-13 15:56, CT Abdomen and Pelvis Without Contrast   CT Abdomen and Pelvis Without  Contrast   REASON FOR EXAM:    (1) flank pain, hematuria; (2) flank pain, hematuria  COMMENTS:       PROCEDURE: CT  - CT ABDOMEN AND PELVIS W0  - Jan 31 2012  3:56PM     RESULT: Indication: Flank Pain    Comparison: None    Technique: Multiple axial images fromthe lung bases to the symphysis   pubis were obtained without oral and without intravenous contrast.    Findings:    The lung bases are clear. There is no pleural or pericardial effusions.  No renal, ureteral, or bladder calculi. No obstructive uropathy. No   perinephric stranding is seen. The kidneys are symmetric in size without   evidence for exophytic mass. The bladder is unremarkable.    The liver demonstrates no focal abnormality. The gallbladder is   unremarkable. The spleen demonstrates no focal abnormality. The adrenal   glands and pancreas are normal.     The unopacified stomach, duodenum, small intestine, and large intestine   are unremarkable, but evaluation is limited by lack of oral contrast. No   normal nor abnormal appendix isvisualized. There is no pneumoperitoneum,   pneumatosis, or portal venous gas. There is no abdominal or pelvic free   fluid. There is no lymphadenopathy.     The abdominal aorta is normal in caliber .  The osseous structures are unremarkable.    IMPRESSION:     1. No urolithiasis or obstructive uropathy.     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   Axis II: none.   Axis III: Absence of Thyroid.   Axis IV: Problems with primary support group  Relationship problems .   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  Treatment Plan: Patient is aware of and understands the risks and benefits of the proposed treatment or treatment changes..   Pt will continue with 1:1 sitter.  She will be transferred to [facility] unit when she is medically cleared.  Started her on Seroquel 45m po qhs for mood symptoms and depression.  Pt will  also be given Ativan 153mpo q4hrs prn for agitation.  case discussed with pt and she became agitated and threatening to call 911. Discussed with nursing staff and telephone was removed from her room.  Electronic Signatures: FaJeronimo NormaMD)  (Signed 02-Jan-14 12:15)  Authored: Chief Complaint, Presenting Symptoms, History of Present Illness, Precipitating Event & Recent Stressors, Target Symptoms, Past Psychiatric Treatment, Substance Abuse History, Past Medical & Surgical History, PAST MEDICAL & SURGICAL HX, CURRENT OUTPATIENT MEDICATIONS, Family History, Social History, History of Trauma or Abuse, Mental Status Exam, Suicide Risk Assessment, Review of Systems, NURSING FLOWSHEETS, LAB, OTHER RESULTS, Assessment & Diagnosis, Treatment Plan   Last Updated: 02-Jan-14 12:15 by FaJeronimo NormaMD)

## 2014-11-17 NOTE — H&P (Signed)
PATIENT NAME:  Martha Proctor, Nehal N MR#:  [id number] DATE OF BIRTH:  1990/08/14  DATE OF ADMISSION:  07/30/2012  IDENTIFYING INFORMATION AND CHIEF COMPLAINT: A 24 year old woman who is seen on the medical service where she is being treated for an overdose of her levothyroxine.   CHIEF COMPLAINT: "I'm not suicidal."   HISTORY OF PRESENT ILLNESS: History obtained from the patient and from the chart. The patient admits that yesterday in a fit of irritability and anger, she swallowed most or all of a bottle of levothyroxine tablets. She reports that her mood has been down and depressed, probably for months if not longer. She feels tired much of the time. Frequent negative thoughts about herself. Has very low self-esteem. Sleep at night is poor. Appetite has been poor. She does not report psychotic symptoms. She admits that at times she has thoughts of wishing that she were dead. Earlier when she first came into the hospital, she was more clear about having intended to kill herself. The patient denies that she is abusing alcohol or drugs. She has frequent panic attacks and reports that she feels like she is unloved and feels like she is a horrible person, like no one in her family loves her and that she is undeserving of infection, including from her baby. She has not been going for outpatient psychiatric treatment.   PAST PSYCHIATRIC HISTORY: She denies that she has had suicide attempts in the past. Evidently has not had psychiatric admission in the past. She has been identified as being depressed and been encouraged to get treatment by family members but has resisted it. She was prescribed Prozac but has not taken it. She does not report any history of psychotic symptoms in the past.   PAST MEDICAL HISTORY: The patient has congenital lack of a thyroid gland and therefore needs to be on thyroid replacement hormone but has resisted taking it. For at least the last year, she has not been taking the thyroid  hormone. Her rationale for this to me is that she feels like she wants to gain weight and that she could do that by not taking her thyroid hormone. Ironically, the depression that she has suffered has actually caused her to lose weight over the past year.   SOCIAL HISTORY: She has a baby about 23 year old. The patient has a boyfriend who is the father of the child. She and the boyfriend do not live together full-time. She mainly just lives with her son. She survives on income received from her mother, although she says that she has a bad relationship overall with her mother. She is not currently working.   FAMILY HISTORY: Positive for a family history of bipolar disorder.   CURRENT MEDICATIONS: Not taking any.   ALLERGIES: No known drug allergies.   REVIEW OF SYSTEMS: Depressed mood. Tired much of the time. Feels ill quite a bit. Does not sleep well at night. No appetite, has been losing weight. Poor self-esteem. Positive suicidal ideation. No psychotic symptoms.   MENTAL STATUS EXAMINATION: Disheveled woman interviewed in a hospital room. She is alert and awake and cooperative with the interview. Eye contact is good. Psychomotor activity is a bit fidgety. Affect is tearful, sad and dysphoric. Mood is stated as being depressed. Thoughts are generally lucid with no evidence of psychotic thinking. Denies auditory or visual hallucinations. She denies acute suicidal or homicidal intent or plan. Intelligence appears to probably be average. Insight and judgment somewhat impaired. Alert and oriented x 4.  PHYSICAL EXAMINATION:  GENERAL: Thin woman.  SKIN: No acute skin lesions.  HEENT: Pupils equal and reactive. Face symmetric.  MUSCULOSKELETAL: Neck and back nontender. Full range of motion at all extremities. NEUROLOGIC: Normal gait. Normal strength and reflexes throughout.  LUNGS: Clear with no wheezes.  HEART: Regular rate and rhythm, normal sounds.  ABDOMEN: Soft, nontender, normal bowel sounds.   VITAL SIGNS: Temperature 98.3, pulse 81, respirations 20, blood pressure 127/83.   LABORATORY RESULTS: Drug screen positive for cannabis and benzodiazepines. Thyroid stimulating hormone was 76.1. Alcohol undetectable. Chemistry panel showed an elevated glucose at 167. CBC showed a slightly elevated white count at 11.9. Urine culture positive for E. coli. Urinalysis grossly positive for urinary tract infection.   ASSESSMENT: This is a 24 year old woman with major depression with an intentional overdose with suicidal ideation. Continues to have multiple symptoms of depression. Poor insight. Symptoms probably compounded by her severely hypothyroid state. Needs hospitalization for treatment and stabilization.   TREATMENT PLAN: Admit to psychiatry under involuntary commitment. Engage patient in groups and activities on the unit. I suggest starting Remeron initially at 15 mg at night for treatment of depression. The patient is agreeable. Get collateral history if possible. Continually monitor mood for improvement.   DIAGNOSES PRINCIPAL AND PRIMARY:  AXIS I:  1.  Major depression, single, severe.  2.  No further.  AXIS II: No diagnosis.  AXIS III: Hypothyroidism, status post overdose.  AXIS IV: Moderate stress from lack of primary resources.  AXIS V: Functioning at time of evaluation: 30.    ____________________________ Audery Amel, MD jtc:jm D: 07/30/2012 19:50:10 ET T: 07/30/2012 20:29:05 ET JOB#: [id number]  cc: Audery Amel, MD, <Dictator> Audery Amel MD ELECTRONICALLY SIGNED 08/01/2012 0:14

## 2014-11-22 ENCOUNTER — Telehealth: Payer: Self-pay | Admitting: Family Medicine

## 2014-11-22 NOTE — Telephone Encounter (Signed)
Pt came by to pick up letter from dr copland.  She dropped off verification of accommodation form to be signed by dr copland.   Put in dr copland's IN BOX

## 2014-12-05 NOTE — H&P (Signed)
L&D Evaluation:  History:  HPI 24 yo G2P0101, EDD of 06/28/13 per LMP & 9 wk US, presents at 37w with c/o painful contractions since 8pm last night. She reports that they started last night, and have increased in pain this morning at 8am.  She reports +FM, No vaginal bleeding or LOF. This is the third admission this pregnancy for contractions. She was seen 2 month ago and treated with terbutaline, then procardia. She received one of two BMZ injections on Sept 7. Hx of preterm labor and delivery at 34 weeks with her first pregnancy and is currently on 17 P injections. No urniary complaints currently. 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 Recent TSH=116. advised to take thyroid meds in AM by itself Patient denied missing pills). She hs a hx of Frequent UTIs and is taking taking Macrobid suppression now. Right foot pain x 1 month-found to have complex regional pain syndrome. Patient is a smoker. She has been treated for pp depression after her last pregnancy. She is O+, RI, VI, GBS negative   Presents with contractions   Patient's Medical History 1) Congential hypothyroidism, 2) Recurrent UTIs, 3) herpes genitalis, 4) panic disorder, 5) complex regional pain syndrome   Patient's Surgical History none   Medications Pre Natal Vitamins  Iron  levothyroxine 29300mcg/ Vicodin for foot pain   Allergies NKDA   Social History tobacco   Family History Non-Contributory   ROS:  ROS see HPI   Exam:  Urine Protein not completed   General breathing through irregular contractions, holding her abdomen   Mental Status clear   Chest clear   Heart normal sinus rhythm, no murmur/gallop/rubs   Abdomen gravid, tender with contractions   Edema no edema   Pelvic no external lesions, 2.5/50/ballotable   Mebranes Intact   FHT normal rate with no decels, 130s, +accels   Ucx irregular, 6-14 minutes   Skin dry   Lymph no  lymphadenopathy   Impression:  Impression IUP at 37 weeks, r/o labor   Plan:  Plan EFM/NST, monitor contractions and for cervical change   Follow Up Appointment need to schedule   Electronic Signatures: Jannet MantisSubudhi, Wayland Baik (CNM)  (Signed 11-Nov-14 12:03)  Authored: L&D Evaluation   Last Updated: 11-Nov-14 12:03 by Jannet MantisSubudhi, Maralyn Witherell (CNM)

## 2014-12-05 NOTE — H&P (Signed)
L&D Evaluation:  History:  HPI 24 yo G2P0101, by D=9 wk US derived EDD of 06/28/13 per LMP & 9 wk US, presents at 9035w1d with c/o LOF since 4PM and  painful contractions every 3 min. Patient was evaluated for painful ctxs yesterday and thru the night and was discharged this AM when dilation stalled at 4cm.   +FM,  no VB.  Hx of preterm labor and delivery at 34 weeks with her first pregnancy and she got 17 P this pregnancy q1-2 weeks until 31-32 weeks.  She received one of two BMZ injections on Sept 7. [facility] WUJ:[id number]:Congenital absence of the thyroid gland-has been noncompliant with taking  Synthroid 200 mcg daily. Recent TSH=146 with a free T4 of 0.21 ( Patient denied missing pills). Her dose was increased to 250 mcg/day.She has a hx of Frequent UTIs and was taking taking Macrobid suppression for a while. Right foot pain x several months-found to have complex regional pain syndrome. Podiatrist plans more comprehensive treatment after delivery. Patient is a smoker. She has been treated for  depression since her last pregnancy. She also has been started on Valtrex at 36 weeks for prophyllaxis. She is O+, RI, VI, GBS negative. Received a flu vaccine and TDAP on 11/6. Desires Nexplanon for birth control.   Presents with contractions, leaking fluid   Patient's Medical History 1) Congential hypothyroidism, 2) Recurrent UTIs, 3) herpes genitalis, 4) panic disorder, 5) complex regional pain syndrome   Patient's Surgical History none   Medications Pre Natal Vitamins  Tylenol (Acetaminophen)  Synthroid 25450mcg/ Valtrex 500 mgm BID   Allergies NKDA   Social History tobacco  1/2 PPD   Family History Non-Contributory   ROS:  ROS see HPI   Exam:  Vital Signs stable   General breathing thru contractions/ having back pain with  ctxs   Mental Status clear   Chest clear   Heart normal sinus rhythm, no murmur/gallop/rubs   Abdomen gravid, tender with contractions   Estimated Fetal  Weight Average for gestational age   Fetal Position OP   Edema no edema   Reflexes 3+   Pelvic no external lesions, clear fluid dripping from vagina. SSE: pooling of milky fluid with positive Nitrazine and ferning. CX:4/80%/-1. No vaginal lesions seen   Mebranes Ruptured   Description milky   FHT normal rate with no decels   FHT Description mod variability-Cat 1   Ucx q3-4   Skin dry   Impression:  Impression IUP at 38 1/7 weeks with SROM in early labor.  Milky looking vaginal fluid due to vaginitis?  R/O chorio-but afebrile and baby is not tachycardic. Extremely hypothyroid   Plan:  Plan EFM/NST, monitor contractions and for cervical change, CBC Stadol for pain. Desires epidural when available.  Consider getting endo consult while IP after delivery   Electronic Signatures: Trinna BalloonGutierrez, Alyria Krack L (CNM)  (Signed 20-Nov-14 02:11)  Authored: L&D Evaluation   Last Updated: 20-Nov-14 02:11 by Trinna BalloonGutierrez, Marykatherine Sherwood L (CNM)

## 2014-12-05 NOTE — H&P (Signed)
L&D Evaluation:  History:  HPI 24 yo G2P0101, by D=9 wk US derived EDD of 06/28/13 per LMP & 9 wk US, presents at 2669w0d with c/o painful contractions. Patient was evaluted for same complaint yesterday cervix unchanged at 2.5cm.  On admission still 2.5cm, however has made change to 4/80/-2 on repeat check 2-hrs later.  +FM, no LOF, no VB.  Hx of preterm labor and delivery at 34 weeks with her first pregnancy and she got 17 P this gregnancy. She received one of two BMZ injections on Sept 7. No urniary complaints currently. [facility] ZOX:[id number]:Congenital absence of the thyroid gland treated with Synthroid 200 mcg daily. Recent TSH=116. advised to take thyroid meds in AM by itself Patient denied missing pills). She hs a hx of Frequent UTIs and is taking taking Macrobid suppression now. Right foot pain x 1 month-found to have complex regional pain syndrome. Patient is a smoker. She has been treated for pp depression after her last pregnancy. She is O+, RI, VI, GBS negative   Presents with contractions   Patient's Medical History 1) Congential hypothyroidism, 2) Recurrent UTIs, 3) herpes genitalis, 4) panic disorder, 5) complex regional pain syndrome   Patient's Surgical History none   Medications Pre Natal Vitamins  Iron  levothyroxine 25900mcg/ Vicodin for foot pain   Allergies NKDA   Social History tobacco   Family History Non-Contributory   ROS:  ROS see HPI   Exam:  Urine Protein not completed   General breathing through irregular contractions, holding her abdomen   Mental Status clear   Chest clear   Heart normal sinus rhythm, no murmur/gallop/rubs   Abdomen gravid, tender with contractions   Estimated Fetal Weight Small for gestational age   Edema no edema   Pelvic no external lesions, 2.5/50/ballotable   Mebranes Intact   FHT normal rate with no decels, 130s, +accels   Ucx irregular, 6-14 minutes   Skin dry   Lymph no lymphadenopathy   Impression:   Impression early labor, IUP at 38 weeks 0 days presenting in term labor   Plan:  Plan EFM/NST, monitor contractions and for cervical change   Comments 1) Early term labor - expectant management, no augmentation until progresses past 5cm  2) Fetus - category I tracing     - last growth scan 30 weeks 1640g c/w 57.3%ile  3) Congenital absence of thyroid - poorly compliants with levothyroxine, TSH's have been consistently around 100's     - last TSH 06/02/13 was 148.8  4) O pos / ABSC neg / RI / VZI / RPR NR / HIV neg / 1-hr 93 / GBS negative  5) Immunization - influenza & TDAP 06/02/13  6) Disposition - anticipate vaginal delivery   Follow Up Appointment need to schedule   Electronic Signatures: Lorrene ReidStaebler, Esther Broyles M (MD)  (Signed [phone]-Nov-14 20:03)  Authored: L&D Evaluation   Last Updated: 18-Nov-14 20:03 by Lorrene ReidStaebler, Bronte Kropf M (MD)

## 2014-12-05 NOTE — H&P (Signed)
L&D Evaluation:  History Expanded:  HPI 24 yo G2P0101 at 3848w3d  gestational age by LMP consistent with 9 week ultrasound.  Her pregnancy has been quite complicated.  She has a history of preterm delivery with G1 at 7133 weeeks, for which she has been taking 17OHPC.  She also has congenital absence of the thyroid gland, for which she is taking synthroid 200 mcg.  There has been some question of her adherance to her medication regimen.  She has had several UTIs during this pregnancy. She is taking macrobid daily for suppression.  She also has had tendonitis in her right foot and has been taking prednisone for this. This has been a major issue in this pregnancy.   She pesented to clinic this morning with lower-mid back pain and lower abdominal pain since about 7am.  She had one loose bowel movement and this helped her back feel better.  However, her pain returned.  She states that it is constant.  She denies nausea/emesis, urinary symptoms (though she does have frequency).  She notes positive fetal movement, leakage of fluid, and vaginal bleeding. She was seen in clinic today for her symptoms.  She had a U/A which appeared clean.  Her cervix was checked and was found to be closed.  No fFN was performed and no other testing for infection was performed.   Gravida 2   Term 0   PreTerm 1   Abortion 0   Living 1   Blood Type (Maternal) O positive   Group B Strep Results Maternal (Result >5wks must be treated as unknown) unknown/result > 5 weeks ago   Maternal HIV Negative   Maternal Syphilis Ab Nonreactive   [facility]   Patient's Medical History 1) Congential hypothyroidism, 2) Recurrent UTIs, 3) herpes genitalis, 4) panic disorder   Patient's Surgical History none   Medications Pre Natal Vitamins  levothyroxine 200mcg   Allergies NKDA   Social History tobacco   Family History Non-Contributory   ROS:  ROS All systems were reviewed.  HEENT, CNS, GI, GU, Respiratory, CV, Renal and  Musculoskeletal systems were found to be normal., unless noted in HPI   Exam:  Vital Signs AFVSS   General no apparent distress   Mental Status clear   Chest moves air well   Abdomen gravid, tender to palpation in suprapubic region, fundus not tender. back is tender along paraspinous muscles and bilaterally to flanks from lower lumbar area to iliac bone, no obvious CVAT   Edema no edema   FHT Description 150/mod var/ occ episodic decels/+10x10 accels   Ucx regular, 3-4 q 10 min   Length of each Contraction 60 seconds   Impression:  Impression 1) Intrauterine pregnancy at 26 weeks, 2) abdominal pain/back pain, 3) history of recurent UTIs, 4) preterm contractions   Plan:  Plan EFM/NST, monitor contractions and for cervical change   Comments 1) since I am unable to obtain an fFN, I will get a cervical length ultrasound.  Follow up management based on this.  Terbutaline to see if gives sx relief. 2) Fetal well being reassuring   Labs:  Lab Results: Routine UA:  29-Aug-14 18:43   Color (UA) Yellow  Clarity (UA) Cloudy  Glucose (UA) Negative  Bilirubin (UA) Negative  Ketones (UA) Negative  Specific Gravity (UA) 1.014  Blood (UA) Negative  pH (UA) 8.0  Protein (UA) Negative  Nitrite (UA) Negative  Leukocyte Esterase (UA) Trace (Result(s) reported on 25 Mar 2013 at 07:08PM.)  RBC (UA) 4 /  HPF  WBC (UA) 13 /HPF  Bacteria (UA) TRACE  Epithelial Cells (UA) 2 /HPF  Mucous (UA) PRESENT  Amorphous Crystal (UA) PRESENT (Result(s) reported on 25 Mar 2013 at 07:08PM.)   Electronic Signatures: Conard NovakJackson, Carylon Tamburro D (MD)  (Signed 29-Aug-14 20:36)  Authored: L&D Evaluation, Labs   Last Updated: 29-Aug-14 20:36 by Conard NovakJackson, Chelesea Weiand D (MD)

## 2014-12-05 NOTE — H&P (Signed)
L&D Evaluation:  History Expanded:  HPI 24 yo G2P0101, EDD of 06/28/13 per LMP & 9 wk US, presents at 336w6d with c/o back pain and contractions. 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 +FM, denies LOF or VB.   Gravida 2   Term 0   PreTerm 1   Abortion 0   Living 1   Blood Type (Maternal) O positive   Group B Strep Results Maternal (Result >5wks must be treated as unknown) unknown/result > 5 weeks ago   Maternal HIV Negative   Maternal Syphilis Ab Nonreactive   [facility]   Patient's Medical History 1) Congential hypothyroidism, 2) Recurrent UTIs, 3) herpes genitalis, 4) panic disorder   Patient's Surgical History none   Medications Pre Natal Vitamins  levothyroxine 200mcg   Allergies NKDA   Social History tobacco   Family History Non-Contributory   ROS:  ROS All systems were reviewed.  HEENT, CNS, GI, GU, Respiratory, CV, Renal and Musculoskeletal systems were found to be normal., unless noted in HPI   Exam:  Vital Signs stable   General appears uncomfortable during contractions   Mental Status clear   Abdomen gravid, tenderness with irregular contractions   Edema no edema   Pelvic no external lesions, cervix closed and thick   Mebranes Intact   FHT normal rate with no decels, reactive for gestational age   Ucx irregular after Terbutaline   Other FFN negative   Impression:  Impression IUP at 27 6/7 wks with preterm contractions   Plan:  Plan discharge   Comments Pt reports contractions have decreased significantly after Terbutaline. We gave Procardia 20 mg po before this and pt reports no change in contractions. Reassured by negative FFN. Pt expressing concern over continued back pain. Pt notes having to "crawl around" d/t tendonitis pain as walker has been  causing discomfort in abdomen.  Discussed pain relief methods such as heat & tylenol. Pt states Flexeril causes headaches (also reporting h/o migraines). Pt inquiring about Norco as Rx'd previously for Tendonitis. Advised against narcotics during pregnancy. Will rx Fioricet for headaches and hopeful relief of back pain. I also discussed giving Betamethasone now, pt states she can't return for 2nd shot tomorrow d/t childcare issues. F/u at office tomorrow as scheduled.   Electronic Signatures: Vella KohlerBrothers, Naomee Nowland K (CNM)  (Signed 07-Sep-14 23:40)  Authored: L&D Evaluation   Last Updated: 07-Sep-14 23:40 by Vella KohlerBrothers, Aurorah Schlachter K (CNM)

## 2014-12-05 NOTE — H&P (Signed)
L&D Evaluation:  History:  HPI 24 yo G2P0101, EDD of 06/28/13 per LMP & 9 wk US, presents at 5547w6d with c/o painful contractions since 0330. The contractions awoke her from sleep. No IC in the last 24 hours. No vaginal bleeding. Has seen more mucoid vaginal discharge over the past few weeks. This is the second admission this pregnancy for contractions. She was seen 1 month ago and treated with terbutaline, then procardia. FFN was negative at that time. Received one of two BMZ injections on Sept 7. Hx of preterm labor and delivery at 34 weeks with her first pregnancy and is currently on 17 P injections. Also reports having 2 loose stools this AM. No dysuria. 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 Recent TSH=116. advised to take thyroid meds in AM by itself Patient denied missing pills). She hs a hx of Frequent UTIs and is taking taking Macrobid suppression now. Right foot pain x 1 month-found to have complex regional pain syndrome. Patient is a smoker. She has been treated for pp depression after her last pregnancy.   Presents with contractions   Patient's Medical History 1) Congential hypothyroidism, 2) Recurrent UTIs, 3) herpes genitalis, 4) panic disorder   Patient's Surgical History none   Medications Pre Natal Vitamins  levothyroxine 22100mcg/ Vicodin for foot pain   Allergies NKDA   Social History tobacco   Family History Non-Contributory   ROS:  ROS see HPI   Exam:  Vital Signs 93/56   Urine Protein 1+   General moaning with pain on arrival, now appears comfortable   Mental Status clear   Chest clear   Heart normal sinus rhythm, no murmur/gallop/rubs   Abdomen gravid, non-tender   Edema no edema   Pelvic no external lesions, long/posterior/?dimple vs closed   Mebranes Intact   FHT normal rate with no decels, 145 baseline with accels to 160   Fetal Heart Rate 145   Ucx q2-3 on arrival, now q4-5    Skin dry   Impression:  Impression IUP at 31 6/7 weeks in threatened preterm labor   Plan:  Plan UA, EFM/NST, monitor contractions and for cervical change, fluids, Tocolysis with terbutaline x1 spaced out ctxs x 30 min, then resumed more frquent contractions. Consider repeating  BMZ.   Electronic Signatures: Trinna BalloonGutierrez, Damean Poffenberger L (CNM)  (Signed 06-Oct-14 09:14)  Authored: L&D Evaluation   Last Updated: 06-Oct-14 09:14 by Trinna BalloonGutierrez, Arthur Speagle L (CNM)

## 2014-12-05 NOTE — H&P (Signed)
L&D Evaluation:  History Expanded:  HPI 24 yo G2P0101, EDD of 06/28/13 per LMP & 9 wk US, presents at 37w  6 days with c/o painful contractions. She reports that they and have increased in pain this morning at 8am.  She reports +FM, No vaginal bleeding or LOF. This is the fourth admission this pregnancy for contractions. She was seen 1 week ago and treated with terbutaline, then procardia. She received one of two BMZ injections on Sept 7. Hx of preterm labor and delivery at 34 weeks with her first pregnancy and she got 17 P this gregnancy. No urniary complaints currently. 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 Recent TSH=116. advised to take thyroid meds in AM by itself Patient denied missing pills). She hs a hx of Frequent UTIs and is taking taking Macrobid suppression now. Right foot pain x 1 month-found to have complex regional pain syndrome. Patient is a smoker. She has been treated for pp depression after her last pregnancy. She is O+, RI, VI, GBS negative   Gravida 2   Term 0   PreTerm 1   Abortion 0   Living 1   Blood Type (Maternal) O positive   Group B Strep Results Maternal (Result >5wks must be treated as unknown) negative   Maternal HIV Negative   Maternal Syphilis Ab Nonreactive   Maternal Varicella Immune   Rubella Results (Maternal) immune   Maternal T-Dap Immune   EDC 28-Jun-2013   Presents with contractions   Patient's Medical History 1) Congential hypothyroidism, 2) Recurrent UTIs, 3) herpes genitalis, 4) panic disorder, 5) complex regional pain syndrome   Patient's Surgical History none   Medications Pre Natal Vitamins  Iron  levothyroxine 27600mcg/ Vicodin for foot pain   Allergies NKDA   Social History tobacco   Family History Non-Contributory   Current Prenatal Course Notable For PreTerm Labor   ROS:  ROS see HPI   Exam:  Urine Protein not completed   General breathing  through irregular contractions, holding her abdomen   Mental Status clear   Chest clear   Heart normal sinus rhythm, no murmur/gallop/rubs   Abdomen gravid, tender with contractions   Estimated Fetal Weight Small for gestational age   Edema no edema   Pelvic no external lesions, 2.5/50/ballotable   Mebranes Intact   FHT normal rate with no decels, 130s, +accels   Ucx irregular, 6-14 minutes   Skin dry   Lymph no lymphadenopathy   Impression:  Impression early labor, IUP at 37 weeks 6 days, r/o labor   Plan:  Plan EFM/NST, monitor contractions and for cervical change   Comments mjorphine and terb to decrease contractions and latent laobor to send home   Follow Up Appointment need to schedule   Electronic Signatures: Martha Proctor, Martha Proctor (MD)  (Signed 17-Nov-14 20:15)  Authored: L&D Evaluation   Last Updated: 17-Nov-14 20:15 by Martha Proctor, Martha Proctor (MD)

## 2014-12-20 ENCOUNTER — Other Ambulatory Visit: Payer: BLUE CROSS/BLUE SHIELD

## 2015-03-06 ENCOUNTER — Ambulatory Visit: Payer: BLUE CROSS/BLUE SHIELD | Admitting: Internal Medicine

## 2015-03-28 ENCOUNTER — Emergency Department
Admission: EM | Admit: 2015-03-28 | Discharge: 2015-03-28 | Disposition: A | Discharge status: Home / Self Care | Payer: BLUE CROSS/BLUE SHIELD | Attending: Emergency Medicine | Admitting: Emergency Medicine

## 2015-03-28 ENCOUNTER — Encounter: Payer: Self-pay | Admitting: Emergency Medicine

## 2015-03-28 DIAGNOSIS — Z720 Tobacco use: Secondary | ICD-10-CM | POA: Insufficient documentation

## 2015-03-28 DIAGNOSIS — Z792 Long term (current) use of antibiotics: Secondary | ICD-10-CM | POA: Insufficient documentation

## 2015-03-28 DIAGNOSIS — Z79899 Other long term (current) drug therapy: Secondary | ICD-10-CM | POA: Insufficient documentation

## 2015-03-28 DIAGNOSIS — Z3202 Encounter for pregnancy test, result negative: Secondary | ICD-10-CM | POA: Insufficient documentation

## 2015-03-28 DIAGNOSIS — N12 Tubulo-interstitial nephritis, not specified as acute or chronic: Secondary | ICD-10-CM | POA: Insufficient documentation

## 2015-03-28 LAB — URINALYSIS COMPLETE WITH MICROSCOPIC (ARMC ONLY)
BILIRUBIN URINE: NEGATIVE
Glucose, UA: NEGATIVE mg/dL
KETONES UR: NEGATIVE mg/dL
NITRITE: NEGATIVE
Protein, ur: 100 mg/dL — AB
Specific Gravity, Urine: 1.016 (ref 1.005–1.030)
pH: 8 (ref 5.0–8.0)

## 2015-03-28 LAB — POCT PREGNANCY, URINE: Preg Test, Ur: NEGATIVE

## 2015-03-28 MED ORDER — CIPROFLOXACIN HCL 500 MG PO TABS: 500.0000 mg | ORAL_TABLET | Freq: Two times a day (BID) | ORAL | Status: AC

## 2015-03-28 MED ORDER — PHENAZOPYRIDINE HCL 200 MG PO TABS: 200.0000 mg | ORAL_TABLET | Freq: Three times a day (TID) | ORAL | Status: DC | PRN

## 2015-03-28 MED ORDER — CEFTRIAXONE SODIUM 1 G IJ SOLR
1.0000 g | Freq: Once | INTRAMUSCULAR | Status: AC
  Administered 2015-03-28: 1 g via INTRAMUSCULAR
  Filled 2015-03-28: qty 10

## 2015-03-28 MED ORDER — HYDROCODONE-ACETAMINOPHEN 5-325 MG PO TABS: 1.0000 | ORAL_TABLET | ORAL | Status: DC | PRN

## 2015-03-28 MED ORDER — LIDOCAINE HCL (PF) 1 % IJ SOLN
INTRAMUSCULAR | Status: AC
  Administered 2015-03-28: 2.1 mL
  Filled 2015-03-28: qty 5

## 2015-03-28 NOTE — ED Notes (Signed)
Pt here for right flank pain and right low back pain

## 2015-03-28 NOTE — Discharge Instructions (Signed)
Pyelonephritis, Adult °Pyelonephritis is a kidney infection. A kidney infection can happen quickly, or it can last for a long time. °HOME CARE  °· Take your medicine (antibiotics) as told. Finish it even if you start to feel better. °· Keep all doctor visits as told. °· Drink enough fluids to keep your pee (urine) clear or pale yellow. °· Only take medicine as told by your doctor. °GET HELP RIGHT AWAY IF:  °· You have a fever or lasting symptoms for more than 2-3 days. °· You have a fever and your symptoms suddenly get worse. °· You cannot take your medicine or drink fluids as told. °· You have chills and shaking. °· You feel very weak or pass out (faint). °· You do not feel better after 2 days. °MAKE SURE YOU: °· Understand these instructions. °· Will watch your condition. °· Will get help right away if you are not doing well or get worse. °Document Released: 08/21/2004 Document Revised: 01/13/2012 Document Reviewed: 01/01/2011 °ExitCare® Patient Information ©2015 [employer]. This information is not intended to replace advice given to you by your health care provider. Make sure you discuss any questions you have with your health care provider. ° °
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
## 2015-03-28 NOTE — ED Notes (Addendum)
Pt complains of urinary frequency and "contraction feeling" , and flank pain since Monday.  Pt has hx of uti and feels like that. Has been taking AZO at home.

## 2015-04-12 ENCOUNTER — Ambulatory Visit (INDEPENDENT_AMBULATORY_CARE_PROVIDER_SITE_OTHER): Payer: Self-pay | Admitting: Internal Medicine

## 2015-04-12 ENCOUNTER — Encounter: Payer: Self-pay | Admitting: Internal Medicine

## 2015-04-12 VITALS — BP 108/78 | HR 100 | Temp 98.5°F | Wt 98.5 lb

## 2015-04-12 DIAGNOSIS — F32A Depression, unspecified: Secondary | ICD-10-CM

## 2015-04-12 DIAGNOSIS — F410 Panic disorder [episodic paroxysmal anxiety] without agoraphobia: Secondary | ICD-10-CM

## 2015-04-12 DIAGNOSIS — F329 Major depressive disorder, single episode, unspecified: Secondary | ICD-10-CM

## 2015-04-12 MED ORDER — CLONAZEPAM 1 MG PO TABS: 1.0000 mg | ORAL_TABLET | Freq: Two times a day (BID) | ORAL | Status: DC

## 2015-04-12 MED ORDER — ESCITALOPRAM OXALATE 10 MG PO TABS
10.0000 mg | ORAL_TABLET | Freq: Every day | ORAL | Status: DC
Start: 2015-04-12 — End: 2015-04-18

## 2015-04-12 NOTE — Patient Instructions (Signed)
Depression  Depression refers to feeling sad, low, down in the dumps, blue, gloomy, or empty. In general, there are two kinds of depression:  1. Normal sadness or normal grief. This kind of depression is one that we all feel from time to time after upsetting life experiences, such as the loss of a job or the ending of a relationship. This kind of depression is considered normal, is short lived, and resolves within a few days to 2 weeks. Depression experienced after the loss of a loved one (bereavement) often lasts longer than 2 weeks but normally gets better with time.  2. Clinical depression. This kind of depression lasts longer than normal sadness or normal grief or interferes with your ability to function at home, at work, and in school. It also interferes with your personal relationships. It affects almost every aspect of your life. Clinical depression is an illness.  Symptoms of depression can also be caused by conditions other than those mentioned above, such as:  · Physical illness. Some physical illnesses, including underactive thyroid gland (hypothyroidism), severe anemia, specific types of cancer, diabetes, uncontrolled seizures, heart and lung problems, strokes, and chronic pain are commonly associated with symptoms of depression.  · Side effects of some prescription medicine. In some people, certain types of medicine can cause symptoms of depression.  · Substance abuse. Abuse of alcohol and illicit drugs can cause symptoms of depression.  SYMPTOMS  Symptoms of normal sadness and normal grief include the following:  · Feeling sad or crying for short periods of time.  · Not caring about anything (apathy).  · Difficulty sleeping or sleeping too much.  · No longer able to enjoy the things you used to enjoy.  · Desire to be by oneself all the time (social isolation).  · Lack of energy or motivation.  · Difficulty concentrating or remembering.  · Change in appetite or weight.  · Restlessness or  agitation.  Symptoms of clinical depression include the same symptoms of normal sadness or normal grief and also the following symptoms:  · Feeling sad or crying all the time.  · Feelings of guilt or worthlessness.  · Feelings of hopelessness or helplessness.  · Thoughts of suicide or the desire to harm yourself (suicidal ideation).  · Loss of touch with reality (psychotic symptoms). Seeing or hearing things that are not real (hallucinations) or having false beliefs about your life or the people around you (delusions and paranoia).  DIAGNOSIS   The diagnosis of clinical depression is usually based on how bad the symptoms are and how long they have lasted. Your health care provider will also ask you questions about your medical history and substance use to find out if physical illness, use of prescription medicine, or substance abuse is causing your depression. Your health care provider may also order blood tests.  TREATMENT   Often, normal sadness and normal grief do not require treatment. However, sometimes antidepressant medicine is given for bereavement to ease the depressive symptoms until they resolve.  The treatment for clinical depression depends on how bad the symptoms are but often includes antidepressant medicine, counseling with a mental health professional, or both. Your health care provider will help to determine what treatment is best for you.  Depression caused by physical illness usually goes away with appropriate medical treatment of the illness. If prescription medicine is causing depression, talk with your health care provider about stopping the medicine, decreasing the dose, or changing to another medicine.  Depression caused by   the abuse of alcohol or illicit drugs goes away when you stop using these substances. Some adults need professional help in order to stop drinking or using drugs.  SEEK IMMEDIATE MEDICAL CARE IF:  · You have thoughts about hurting yourself or others.  · You lose touch  with reality (have psychotic symptoms).  · You are taking medicine for depression and have a serious side effect.  FOR MORE INFORMATION  · National Alliance on Mental Illness: www.nami.org   · National Institute of Mental Health: www.nimh.nih.gov   Document Released: 07/11/2000 Document Revised: 11/28/2013 Document Reviewed: 10/13/2011  ExitCare® Patient Information ©2015 [employer]. This information is not intended to replace advice given to you by your health care provider. Make sure you discuss any questions you have with your health care provider.  Depression  Depression refers to feeling sad, low, down in the dumps, blue, gloomy, or empty. In general, there are two kinds of depression:  3. Normal sadness or normal grief. This kind of depression is one that we all feel from time to time after upsetting life experiences, such as the loss of a job or the ending of a relationship. This kind of depression is considered normal, is short lived, and resolves within a few days to 2 weeks. Depression experienced after the loss of a loved one (bereavement) often lasts longer than 2 weeks but normally gets better with time.  4. Clinical depression. This kind of depression lasts longer than normal sadness or normal grief or interferes with your ability to function at home, at work, and in school. It also interferes with your personal relationships. It affects almost every aspect of your life. Clinical depression is an illness.  Symptoms of depression can also be caused by conditions other than those mentioned above, such as:  · Physical illness. Some physical illnesses, including underactive thyroid gland (hypothyroidism), severe anemia, specific types of cancer, diabetes, uncontrolled seizures, heart and lung problems, strokes, and chronic pain are commonly associated with symptoms of depression.  · Side effects of some prescription medicine. In some people, certain types of medicine can cause symptoms of  depression.  · Substance abuse. Abuse of alcohol and illicit drugs can cause symptoms of depression.  SYMPTOMS  Symptoms of normal sadness and normal grief include the following:  · Feeling sad or crying for short periods of time.  · Not caring about anything (apathy).  · Difficulty sleeping or sleeping too much.  · No longer able to enjoy the things you used to enjoy.  · Desire to be by oneself all the time (social isolation).  · Lack of energy or motivation.  · Difficulty concentrating or remembering.  · Change in appetite or weight.  · Restlessness or agitation.  Symptoms of clinical depression include the same symptoms of normal sadness or normal grief and also the following symptoms:  · Feeling sad or crying all the time.  · Feelings of guilt or worthlessness.  · Feelings of hopelessness or helplessness.  · Thoughts of suicide or the desire to harm yourself (suicidal ideation).  · Loss of touch with reality (psychotic symptoms). Seeing or hearing things that are not real (hallucinations) or having false beliefs about your life or the people around you (delusions and paranoia).  DIAGNOSIS   The diagnosis of clinical depression is usually based on how bad the symptoms are and how long they have lasted. Your health care provider will also ask you questions about your medical history and substance use to find out if   physical illness, use of prescription medicine, or substance abuse is causing your depression. Your health care provider may also order blood tests.  TREATMENT   Often, normal sadness and normal grief do not require treatment. However, sometimes antidepressant medicine is given for bereavement to ease the depressive symptoms until they resolve.  The treatment for clinical depression depends on how bad the symptoms are but often includes antidepressant medicine, counseling with a mental health professional, or both. Your health care provider will help to determine what treatment is best for  you.  Depression caused by physical illness usually goes away with appropriate medical treatment of the illness. If prescription medicine is causing depression, talk with your health care provider about stopping the medicine, decreasing the dose, or changing to another medicine.  Depression caused by the abuse of alcohol or illicit drugs goes away when you stop using these substances. Some adults need professional help in order to stop drinking or using drugs.  SEEK IMMEDIATE MEDICAL CARE IF:  · You have thoughts about hurting yourself or others.  · You lose touch with reality (have psychotic symptoms).  · You are taking medicine for depression and have a serious side effect.  FOR MORE INFORMATION  · National Alliance on Mental Illness: www.nami.org   · National Institute of Mental Health: www.nimh.nih.gov   Document Released: 07/11/2000 Document Revised: 11/28/2013 Document Reviewed: 10/13/2011  ExitCare® Patient Information ©2015 [employer]. This information is not intended to replace advice given to you by your health care provider. Make sure you discuss any questions you have with your health care provider.

## 2015-04-12 NOTE — Progress Notes (Signed)
Pre visit review using our clinic review tool, if applicable. No additional management support is needed unless otherwise documented below in the visit note. 
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
## 2015-04-16 ENCOUNTER — Telehealth: Payer: Self-pay | Admitting: *Deleted

## 2015-04-16 NOTE — Telephone Encounter (Signed)
Pt's mother left voicemail at Triage (no DPR to speak with her) but we can call pt directly, saying that the Klonopin that was prescribed was over $300 (no insurance) and pt is requesting an alt med that is less expensive

## 2015-04-16 NOTE — Telephone Encounter (Signed)
I just spoke to PharmD at [facility] where she picked it up.   We could make some changes that would change things up only somewhat but make it cheaper. She could also shop around some, too. Often [facility] or some of the smaller pharmacies will have a cheaper price.   If agreeable,   We could change from Lexapro to Celexa, which is its older, somewhat cheaper cousin.  We could change from Klonopin to another older medication like valium.   I am ok doing that over the phone if she would like, to be picked up at the pharmacy in 1 month / subbed for refills.

## 2015-04-17 NOTE — Telephone Encounter (Signed)
Patient has appointment scheduled with you tomorrow 04/18/2015.  Ok to discuss medication change at office visit?

## 2015-04-17 NOTE — Telephone Encounter (Signed)
Agree with plan of care. She was seen a few days ago, and I will evaluate face to face early in the AM.   If decompensates, ER or Behavioral health evaluation is absolutely appropriate.  Electronically Signed  By: [name], MD On: 04/17/2015 5:03 PM

## 2015-04-17 NOTE — Telephone Encounter (Signed)
Yes, this is ok.

## 2015-04-17 NOTE — Telephone Encounter (Signed)
pts mother left v/m pt needs different med to take place of Klonopin; spoke with pt and she wants different med to take place of Klonopin; I asked pt if she could wait to see Dr Patsy Lager at her appt on 09/*21/16 at 9:30 am; pt said she did not know if she would feel like coming; pts mother is there and she said she will bring pt to 04/18/15 appt. Dr Reece Agar said to give psych# if needed tonight; pt said no SI/HI but gave pts mom (per pt request) behaviorial health # [phone] if needed this evening or can go to ED if needed. Pt and pts mom voiced understanding.

## 2015-04-18 ENCOUNTER — Encounter: Payer: Self-pay | Admitting: Family Medicine

## 2015-04-18 ENCOUNTER — Ambulatory Visit (INDEPENDENT_AMBULATORY_CARE_PROVIDER_SITE_OTHER): Payer: Self-pay | Admitting: Family Medicine

## 2015-04-18 VITALS — BP 101/70 | HR 56 | Temp 98.5°F | Ht 60.5 in | Wt 96.5 lb

## 2015-04-18 DIAGNOSIS — F410 Panic disorder [episodic paroxysmal anxiety] without agoraphobia: Secondary | ICD-10-CM

## 2015-04-18 DIAGNOSIS — N23 Unspecified renal colic: Secondary | ICD-10-CM

## 2015-04-18 DIAGNOSIS — N10 Acute tubulo-interstitial nephritis: Secondary | ICD-10-CM

## 2015-04-18 DIAGNOSIS — F339 Major depressive disorder, recurrent, unspecified: Secondary | ICD-10-CM

## 2015-04-18 LAB — POCT URINALYSIS DIPSTICK
Bilirubin, UA: NEGATIVE
Blood, UA: NEGATIVE
Glucose, UA: NEGATIVE
Ketones, UA: NEGATIVE
Leukocytes, UA: NEGATIVE
Nitrite, UA: NEGATIVE
Urobilinogen, UA: 0.2
pH, UA: 6

## 2015-04-18 MED ORDER — CIPROFLOXACIN HCL 500 MG PO TABS: 500.0000 mg | ORAL_TABLET | Freq: Two times a day (BID) | ORAL | Status: DC

## 2015-04-18 MED ORDER — CITALOPRAM HYDROBROMIDE 20 MG PO TABS: 20.0000 mg | ORAL_TABLET | Freq: Every day | ORAL | Status: DC

## 2015-04-18 NOTE — Progress Notes (Signed)
Pre visit review using our clinic review tool, if applicable. No additional management support is needed unless otherwise documented below in the visit note. 
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
## 2015-04-18 NOTE — Patient Instructions (Signed)

## 2015-04-21 LAB — URINE CULTURE: Colony Count: 75000

## 2015-04-25 ENCOUNTER — Telehealth: Payer: Self-pay | Admitting: Family Medicine

## 2015-04-25 ENCOUNTER — Ambulatory Visit: Payer: Self-pay | Admitting: Family Medicine

## 2015-04-25 NOTE — Telephone Encounter (Signed)
Pt did not come in for their appt today for follow up. Please let me know if pt needs to be contacted immediately for follow up or no follow up needed. Best phone number to contact pt is [phone]. °

## 2015-04-25 NOTE — Telephone Encounter (Signed)
i would recommend follow-up as we discussed, either with me or psychiatry. To my knowledge, she has not found a psychiatrist yet.

## 2015-04-26 NOTE — Telephone Encounter (Signed)
Tried contacting pt to reschedule appointment, no voicemail to leave message.

## 2015-04-30 NOTE — Telephone Encounter (Signed)
Patient returned call.  Patient said she is having kidney pain.  Patient said she can't eat and barely drinks.  Patient said she's sleeping all the time.  There were no openings for today.  Patient scheduled appointment on 05/02/15, but wanted to know if she can be seen today.

## 2015-04-30 NOTE — Telephone Encounter (Signed)
Left message for patient to return call.

## 2015-04-30 NOTE — Telephone Encounter (Signed)
Per our discussion, can you help sort out the level or urgency here?  I physically am unable to add on patients today. 10/5 may be too far for an evaluation, though.

## 2015-05-01 NOTE — Telephone Encounter (Signed)
Spoke to patient.  She is still c/o pain and decreased appetite.  No more open appointments today.  At this point she prefers to wait and see Dr. Patsy Lager only.  Verified appointment scheduled for 1015 tomorrow morning.  Patient does not appear to be in any distress.

## 2015-05-02 ENCOUNTER — Encounter: Payer: Self-pay | Admitting: Family Medicine

## 2015-05-02 ENCOUNTER — Ambulatory Visit: Payer: Self-pay | Admitting: Family Medicine

## 2015-05-02 ENCOUNTER — Ambulatory Visit (INDEPENDENT_AMBULATORY_CARE_PROVIDER_SITE_OTHER): Payer: Self-pay | Admitting: Family Medicine

## 2015-05-02 VITALS — BP 103/66 | HR 65 | Temp 98.5°F | Ht 60.5 in | Wt 97.5 lb

## 2015-05-02 DIAGNOSIS — F410 Panic disorder [episodic paroxysmal anxiety] without agoraphobia: Secondary | ICD-10-CM

## 2015-05-02 DIAGNOSIS — F339 Major depressive disorder, recurrent, unspecified: Secondary | ICD-10-CM

## 2015-05-02 DIAGNOSIS — N12 Tubulo-interstitial nephritis, not specified as acute or chronic: Secondary | ICD-10-CM

## 2015-05-02 DIAGNOSIS — F411 Generalized anxiety disorder: Secondary | ICD-10-CM

## 2015-05-02 DIAGNOSIS — M549 Dorsalgia, unspecified: Secondary | ICD-10-CM

## 2015-05-02 DIAGNOSIS — K121 Other forms of stomatitis: Secondary | ICD-10-CM

## 2015-05-02 MED ORDER — VALACYCLOVIR HCL 1 G PO TABS: 2000.0000 mg | ORAL_TABLET | Freq: Two times a day (BID) | ORAL | Status: DC
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
## 2015-05-02 NOTE — Progress Notes (Signed)
Pre visit review using our clinic review tool, if applicable. No additional management support is needed unless otherwise documented below in the visit note. 

## 2015-05-03 ENCOUNTER — Encounter: Payer: Self-pay | Admitting: Family Medicine

## 2015-05-15 ENCOUNTER — Encounter: Payer: Self-pay | Admitting: Urology

## 2015-05-15 ENCOUNTER — Ambulatory Visit: Payer: Self-pay | Admitting: Urology

## 2015-05-16 ENCOUNTER — Ambulatory Visit: Payer: Self-pay | Admitting: Family Medicine

## 2015-05-16 ENCOUNTER — Telehealth: Payer: Self-pay | Admitting: Family Medicine

## 2015-05-16 NOTE — Telephone Encounter (Signed)
Pt did not come in for their appt today for follow up. Please let me know if pt needs to be contacted immediately for follow up or no follow up needed. Best phone number to contact pt is [phone].

## 2015-05-16 NOTE — Telephone Encounter (Signed)
Recommend follow-up next week - she should be getting in with psychiatry as we planned, too

## 2015-05-21 ENCOUNTER — Ambulatory Visit: Payer: Self-pay | Admitting: Family Medicine

## 2015-05-21 DIAGNOSIS — Z0289 Encounter for other administrative examinations: Secondary | ICD-10-CM

## 2015-06-19 ENCOUNTER — Ambulatory Visit
Admission: EM | Admit: 2015-06-19 | Discharge: 2015-06-19 | Disposition: A | Discharge status: Home / Self Care | Payer: Self-pay | Attending: Family Medicine | Admitting: Family Medicine

## 2015-06-19 DIAGNOSIS — J029 Acute pharyngitis, unspecified: Secondary | ICD-10-CM

## 2015-06-19 DIAGNOSIS — B349 Viral infection, unspecified: Secondary | ICD-10-CM

## 2015-06-19 DIAGNOSIS — H6123 Impacted cerumen, bilateral: Secondary | ICD-10-CM

## 2015-06-19 LAB — RAPID STREP SCREEN (MED CTR MEBANE ONLY): Streptococcus, Group A Screen (Direct): NEGATIVE

## 2015-06-19 MED ORDER — FLUTICASONE PROPIONATE 50 MCG/ACT NA SUSP: 2.0000 | Freq: Every day | NASAL | Status: DC
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
## 2015-06-19 NOTE — ED Notes (Signed)
Pt c/o cough with congestion for the past 2-3 days, states in the past 2 days is having increased sore throat with swelling and fever.

## 2015-06-19 NOTE — Discharge Instructions (Signed)
Pharyngitis °Pharyngitis is redness, pain, and swelling (inflammation) of your pharynx.  °CAUSES  °Pharyngitis is usually caused by infection. Most of the time, these infections are from viruses (viral) and are part of a cold. However, sometimes pharyngitis is caused by bacteria (bacterial). Pharyngitis can also be caused by allergies. Viral pharyngitis may be spread from person to person by coughing, sneezing, and personal items or utensils (cups, forks, spoons, toothbrushes). Bacterial pharyngitis may be spread from person to person by more intimate contact, such as kissing.  °SIGNS AND SYMPTOMS  °Symptoms of pharyngitis include:   °· Sore throat.   °· Tiredness (fatigue).   °· Low-grade fever.   °· Headache. °· Joint pain and muscle aches. °· Skin rashes. °· Swollen lymph nodes. °· Plaque-like film on throat or tonsils (often seen with bacterial pharyngitis). °DIAGNOSIS  °Your health care provider will ask you questions about your illness and your symptoms. Your medical history, along with a physical exam, is often all that is needed to diagnose pharyngitis. Sometimes, a rapid strep test is done. Other lab tests may also be done, depending on the suspected cause.  °TREATMENT  °Viral pharyngitis will usually get better in 3-4 days without the use of medicine. Bacterial pharyngitis is treated with medicines that kill germs (antibiotics).  °HOME CARE INSTRUCTIONS  °· Drink enough water and fluids to keep your urine clear or pale yellow.   °· Only take over-the-counter or prescription medicines as directed by your health care provider:   °· If you are prescribed antibiotics, make sure you finish them even if you start to feel better.   °· Do not take aspirin.   °· Get lots of rest.   °· Gargle with 8 oz of salt water (½ tsp of salt per 1 qt of water) as often as every 1-2 hours to soothe your throat.   °· Throat lozenges (if you are not at risk for choking) or sprays may be used to soothe your throat. °SEEK MEDICAL  CARE IF:  °· You have large, tender lumps in your neck. °· You have a rash. °· You cough up green, yellow-brown, or bloody spit. °SEEK IMMEDIATE MEDICAL CARE IF:  °· Your neck becomes stiff. °· You drool or are unable to swallow liquids. °· You vomit or are unable to keep medicines or liquids down. °· You have severe pain that does not go away with the use of recommended medicines. °· You have trouble breathing (not caused by a stuffy nose). °MAKE SURE YOU:  °· Understand these instructions. °· Will watch your condition. °· Will get help right away if you are not doing well or get worse. °  °This information is not intended to replace advice given to you by your health care provider. Make sure you discuss any questions you have with your health care provider. °  °Document Released: 07/14/2005 Document Revised: 05/04/2013 Document Reviewed: 03/21/2013 °Elsevier Interactive Patient Education ©2016 Elsevier Inc. ° °Rapid Strep Test °Strep throat is a bacterial infection caused by the bacteria Streptococcus pyogenes. A rapid strep test is the quickest way to check if these bacteria are causing your sore throat. The test can be done at your health care provider's office. Results are usually ready in 10-20 minutes. °You may have this test if you have symptoms of strep throat. These include:  °· A red throat with yellow or white spots. °· Neck swelling and tenderness. °· Fever. °· Loss of appetite. °· Trouble breathing or swallowing. °· Rash. °· Dehydration. °This test requires a sample of fluid from the   back of your throat and tonsils. Your health care provider may hold down your tongue with a tongue depressor and use a swab to collect the sample.  Your health care provider may collect a second sample at the same time. The second sample may be used for a throat culture. In a culture test, the sample is combined with a substance that encourages bacteria to grow. It takes longer to get the results of the throat culture  test, but they are more accurate. They can confirm the results from a rapid strep test, or show that those results were wrong. RESULTS  It is your responsibility to obtain your test results. Ask the lab or department performing the test when and how you will get your results. Contact your health care provider to discuss any questions you have about your results.  The results of the rapid strep test will be negative or positive.  Meaning of Negative Test Results If the result of your rapid strep test is negative, then it means:   It is likely that you do not have strep throat.  A virus may be causing your sore throat. Your health care provider may do a throat culture to confirm the results of the rapid strep test. The throat culture can also identify the different strains of strep bacteria. Meaning of Positive Test Results If the result of your rapid strep test is positive, then it means:  It is likely that you do have strep throat.  You may have to take antibiotics. Your health care provider may do a throat culture to confirm the results of the rapid strep test. Strep throat usually requires a course of antibiotics.    This information is not intended to replace advice given to you by your health care provider. Make sure you discuss any questions you have with your health care provider.   Document Released: 08/21/2004 Document Revised: 08/04/2014 Document Reviewed: 10/20/2013 Elsevier Interactive Patient Education 2016 Elsevier Inc.  Sore Throat A sore throat is pain, burning, irritation, or scratchiness of the throat. There is often pain or tenderness when swallowing or talking. A sore throat may be accompanied by other symptoms, such as coughing, sneezing, fever, and swollen neck glands. A sore throat is often the first sign of another sickness, such as a cold, flu, strep throat, or mononucleosis (commonly known as mono). Most sore throats go away without medical treatment. CAUSES  The  most common causes of a sore throat include:  A viral infection, such as a cold, flu, or mono.  A bacterial infection, such as strep throat, tonsillitis, or whooping cough.  Seasonal allergies.  Dryness in the air.  Irritants, such as smoke or pollution.  Gastroesophageal reflux disease (GERD). HOME CARE INSTRUCTIONS   Only take over-the-counter medicines as directed by your caregiver.  Drink enough fluids to keep your urine clear or pale yellow.  Rest as needed.  Try using throat sprays, lozenges, or sucking on hard candy to ease any pain (if older than 4 years or as directed).  Sip warm liquids, such as broth, herbal tea, or warm water with honey to relieve pain temporarily. You may also eat or drink cold or frozen liquids such as frozen ice pops.  Gargle with salt water (mix 1 tsp salt with 8 oz of water).  Do not smoke and avoid secondhand smoke.  Put a cool-mist humidifier in your bedroom at night to moisten the air. You can also turn on a hot shower and sit in  the bathroom with the door closed for 5-10 minutes. SEEK IMMEDIATE MEDICAL CARE IF:  You have difficulty breathing.  You are unable to swallow fluids, soft foods, or your saliva.  You have increased swelling in the throat.  Your sore throat does not get better in 7 days.  You have nausea and vomiting.  You have a fever or persistent symptoms for more than 2-3 days.  You have a fever and your symptoms suddenly get worse. MAKE SURE YOU:   Understand these instructions.  Will watch your condition.  Will get help right away if you are not doing well or get worse.   This information is not intended to replace advice given to you by your health care provider. Make sure you discuss any questions you have with your health care provider.   Document Released: 08/21/2004 Document Revised: 08/04/2014 Document Reviewed: 03/21/2012 Elsevier Interactive Patient Education [employer] Elsevier Inc.

## 2015-06-21 LAB — CULTURE, GROUP A STREP (THRC)

## 2015-07-03 ENCOUNTER — Encounter: Payer: Self-pay | Admitting: Emergency Medicine

## 2015-07-03 ENCOUNTER — Ambulatory Visit
Admission: EM | Admit: 2015-07-03 | Discharge: 2015-07-03 | Disposition: A | Discharge status: Home / Self Care | Payer: Self-pay | Attending: Family Medicine | Admitting: Family Medicine

## 2015-07-03 DIAGNOSIS — N390 Urinary tract infection, site not specified: Secondary | ICD-10-CM

## 2015-07-03 LAB — URINALYSIS COMPLETE WITH MICROSCOPIC (ARMC ONLY)
BILIRUBIN URINE: NEGATIVE
Glucose, UA: NEGATIVE mg/dL
Nitrite: POSITIVE — AB
PH: 6 (ref 5.0–8.0)

## 2015-07-03 LAB — PREGNANCY, URINE: Preg Test, Ur: NEGATIVE

## 2015-07-03 MED ORDER — CIPROFLOXACIN HCL 500 MG PO TABS: 500.0000 mg | ORAL_TABLET | Freq: Two times a day (BID) | ORAL | Status: DC
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
## 2015-07-03 NOTE — ED Notes (Signed)
Patient c/o back pain and dysuria since yesterday.

## 2015-07-05 LAB — URINE CULTURE
Culture: >=100000
SPECIAL REQUESTS: NORMAL

## 2015-07-05 NOTE — ED Notes (Addendum)
Spoke w patient to inquire on her medical status. Has not yet filled her Rx, as she doesn't get paid for a couple more days. [facility], and will fill it later this week. Advised to fill the RS as soon as she can, and drink plenty of fluids. She has been instructed to go to the ED or return to urgent care if she gets worse, but she should feel better after she starts the Rx

## 2015-07-12 ENCOUNTER — Telehealth: Payer: Self-pay

## 2015-07-12 NOTE — Telephone Encounter (Signed)
Left message for patient to return phone call regarding flu shot.  

## 2015-07-12 NOTE — Telephone Encounter (Signed)
Patient returned Brooke's call.  Patient had her flu shot the beginning of November.  [facility], but she couldn't pronounce the name of the place where she had the flu shot.

## 2015-08-14 ENCOUNTER — Other Ambulatory Visit: Payer: Self-pay

## 2015-08-14 NOTE — Telephone Encounter (Signed)
Pt left v/m requesting refill on Celexa; pt does not have money to come to office for appt. Pt last seen 05/02/15 and was to return in 2 weeks for F/U; do not see where pt was seen. Pt request cb. Is it OK to refill med?

## 2015-08-15 MED ORDER — CITALOPRAM HYDROBROMIDE 20 MG PO TABS: 20.0000 mg | ORAL_TABLET | Freq: Every day | ORAL | Status: DC

## 2015-09-19 ENCOUNTER — Other Ambulatory Visit: Payer: Self-pay | Admitting: Family Medicine
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
## 2015-09-20 NOTE — Telephone Encounter (Signed)
Left message for Martha Proctor that Dr. Patsy Lager will not refill her clonazepam.  Will address further at her appointment on 10/08/2015 per Dr. Patsy Lager.

## 2015-09-20 NOTE — Telephone Encounter (Signed)
Pt called and would like refill on clonazePAM (KLONOPIN) 1 MG tablet.   Scheduled appointment for 10/08/15 with PCP, pt given information for [facility] at [facility] for possible financial help if pt qualifies.  Please call in if possible to [facility] and pt requests a callback when completed.

## 2015-09-20 NOTE — Telephone Encounter (Signed)
Changed appointment to 30 minutes, routed to CMA to call pt [id number] denial / lt

## 2015-09-20 NOTE — Telephone Encounter (Signed)
Denied. Complex situation - I have spoken with the patient about this previously, and I had tried to set her up with psychiatry. I can speak with her again and work on this again if it never happened. 30 mins appointment.

## 2015-09-26 ENCOUNTER — Encounter: Payer: Self-pay | Admitting: Family Medicine

## 2015-09-26 ENCOUNTER — Ambulatory Visit (INDEPENDENT_AMBULATORY_CARE_PROVIDER_SITE_OTHER): Payer: Self-pay | Admitting: Family Medicine

## 2015-09-26 VITALS — BP 90/70 | HR 80 | Temp 98.1°F | Ht 60.5 in | Wt 104.5 lb

## 2015-09-26 DIAGNOSIS — F339 Major depressive disorder, recurrent, unspecified: Secondary | ICD-10-CM

## 2015-09-26 DIAGNOSIS — F410 Panic disorder [episodic paroxysmal anxiety] without agoraphobia: Secondary | ICD-10-CM

## 2015-09-26 DIAGNOSIS — F411 Generalized anxiety disorder: Secondary | ICD-10-CM

## 2015-09-26 DIAGNOSIS — R509 Fever, unspecified: Secondary | ICD-10-CM

## 2015-09-26 DIAGNOSIS — R6889 Other general symptoms and signs: Secondary | ICD-10-CM

## 2015-09-26 MED ORDER — HYDROXYZINE HCL 50 MG PO TABS: 25.0000 mg | ORAL_TABLET | Freq: Four times a day (QID) | ORAL | Status: DC | PRN

## 2015-09-26 MED ORDER — BUSPIRONE HCL 7.5 MG PO TABS: 7.5000 mg | ORAL_TABLET | Freq: Two times a day (BID) | ORAL | Status: DC

## 2015-09-26 MED ORDER — CITALOPRAM HYDROBROMIDE 20 MG PO TABS: 20.0000 mg | ORAL_TABLET | Freq: Every day | ORAL | Status: DC
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
## 2015-09-26 NOTE — Progress Notes (Signed)
Pre visit review using our clinic review tool, if applicable. No additional management support is needed unless otherwise documented below in the visit note. 

## 2015-10-08 ENCOUNTER — Ambulatory Visit: Payer: Self-pay | Admitting: Family Medicine

## 2015-12-12 ENCOUNTER — Telehealth: Payer: Self-pay | Admitting: *Deleted

## 2015-12-12 NOTE — Telephone Encounter (Signed)
Spoke to patient who is tearful.  Advised her to follow up immediately at [facility] or closest ED for evaluation.  They will admit her if needed or set her up with outpatient follow up, whichever is appropriate.  She is agreeable to this and states she will go now - she did not clarify which facility.

## 2015-12-12 NOTE — Telephone Encounter (Signed)
We discussed face to face. If she is not doing well with her history and she is in any way thinking she needs inpatient care, then she should go and be evaluated at the hospital right now.

## 2015-12-12 NOTE — Telephone Encounter (Signed)
Spoke to pt who was very upset regarding her depression. Pt states that her depression has increased and she is wanting to know if she is needing to be admitted. Pt states Dr Patsy Lageropland has called her before on his personal cell phone and she is requesting a call back. Pt advised Dr Patsy Lageropland in clinic, but would be sent a message, advising of situation. Pt appt sched for 5/22, which was the first available.

## 2015-12-17 ENCOUNTER — Ambulatory Visit: Payer: Self-pay | Admitting: Family Medicine

## 2016-01-07 ENCOUNTER — Other Ambulatory Visit: Payer: Self-pay | Admitting: Family Medicine

## 2016-01-08 NOTE — Telephone Encounter (Signed)
Last office visit 09/26/2015.  Last refilled 11/09/2014 for #90 with 1 refill.  Last Thyroid Labs 10/16/2014.  Ok to refill?

## 2016-01-08 NOTE — Telephone Encounter (Signed)
Patient is historically notably noncompliant and has never been compliant enough recently to check thyroid labs.

## 2016-01-14 ENCOUNTER — Emergency Department: Payer: Self-pay

## 2016-01-14 ENCOUNTER — Emergency Department
Admission: EM | Admit: 2016-01-14 | Discharge: 2016-01-14 | Disposition: A | Discharge status: Home / Self Care | Payer: Self-pay | Attending: Emergency Medicine | Admitting: Emergency Medicine

## 2016-01-14 ENCOUNTER — Encounter: Payer: Self-pay | Admitting: Emergency Medicine

## 2016-01-14 DIAGNOSIS — F339 Major depressive disorder, recurrent, unspecified: Secondary | ICD-10-CM | POA: Insufficient documentation

## 2016-01-14 DIAGNOSIS — Z792 Long term (current) use of antibiotics: Secondary | ICD-10-CM | POA: Insufficient documentation

## 2016-01-14 DIAGNOSIS — E039 Hypothyroidism, unspecified: Secondary | ICD-10-CM | POA: Insufficient documentation

## 2016-01-14 DIAGNOSIS — N390 Urinary tract infection, site not specified: Secondary | ICD-10-CM | POA: Insufficient documentation

## 2016-01-14 DIAGNOSIS — F17210 Nicotine dependence, cigarettes, uncomplicated: Secondary | ICD-10-CM | POA: Insufficient documentation

## 2016-01-14 DIAGNOSIS — Z79899 Other long term (current) drug therapy: Secondary | ICD-10-CM | POA: Insufficient documentation

## 2016-01-14 DIAGNOSIS — R1084 Generalized abdominal pain: Secondary | ICD-10-CM

## 2016-01-14 DIAGNOSIS — K5901 Slow transit constipation: Secondary | ICD-10-CM | POA: Insufficient documentation

## 2016-01-14 LAB — URINALYSIS COMPLETE WITH MICROSCOPIC (ARMC ONLY)
BILIRUBIN URINE: NEGATIVE
GLUCOSE, UA: NEGATIVE mg/dL
Ketones, ur: NEGATIVE mg/dL
Nitrite: POSITIVE — AB
Protein, ur: NEGATIVE mg/dL
Specific Gravity, Urine: 1.019 (ref 1.005–1.030)
pH: 6 (ref 5.0–8.0)

## 2016-01-14 LAB — CBC
HCT: 40.8 % (ref 35.0–47.0)
HEMOGLOBIN: 13.7 g/dL (ref 12.0–16.0)
MCH: 32 pg (ref 26.0–34.0)
MCHC: 33.6 g/dL (ref 32.0–36.0)
MCV: 95.2 fL (ref 80.0–100.0)
Platelets: 264 10*3/uL (ref 150–440)
RBC: 4.28 MIL/uL (ref 3.80–5.20)
RDW: 13.9 % (ref 11.5–14.5)
WBC: 10.6 10*3/uL (ref 3.6–11.0)

## 2016-01-14 LAB — BASIC METABOLIC PANEL
ANION GAP: 7 (ref 5–15)
BUN: 16 mg/dL (ref 6–20)
CHLORIDE: 107 mmol/L (ref 101–111)
CO2: 25 mmol/L (ref 22–32)
Calcium: 9.1 mg/dL (ref 8.9–10.3)
Creatinine, Ser: 0.96 mg/dL (ref 0.44–1.00)
GFR calc non Af Amer: >60 mL/min (ref >60)
Glucose, Bld: 102 mg/dL — ABNORMAL HIGH (ref 65–99)
POTASSIUM: 3.8 mmol/L (ref 3.5–5.1)
Sodium: 139 mmol/L (ref 135–145)

## 2016-01-14 LAB — PREGNANCY, URINE: PREG TEST UR: NEGATIVE

## 2016-01-14 MED ORDER — SULFAMETHOXAZOLE-TRIMETHOPRIM 800-160 MG PO TABS: 1.0000 | ORAL_TABLET | Freq: Two times a day (BID) | ORAL | Status: DC

## 2016-01-14 MED ORDER — OXYCODONE-ACETAMINOPHEN 5-325 MG PO TABS
ORAL_TABLET | ORAL | Status: AC
  Filled 2016-01-14: qty 2

## 2016-01-14 MED ORDER — OXYCODONE-ACETAMINOPHEN 5-325 MG PO TABS
2.0000 | ORAL_TABLET | Freq: Once | ORAL | Status: AC
  Administered 2016-01-14: 2 via ORAL

## 2016-01-14 MED ORDER — POLYETHYLENE GLYCOL 3350 17 G PO PACK: 17.0000 g | PACK | Freq: Every day | ORAL | Status: DC

## 2016-01-14 MED ORDER — KETOROLAC TROMETHAMINE 10 MG PO TABS: 10.0000 mg | ORAL_TABLET | Freq: Four times a day (QID) | ORAL | Status: DC | PRN
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
## 2016-01-14 NOTE — ED Notes (Signed)
Pt presents with fatigue for a few days with some pain.pt has not had her synthroid in one mth.

## 2016-01-14 NOTE — ED Notes (Signed)
Pt reports low abd pain and back pain.  Denies dysuria.  No n/v/d  No vag d/c.  Pt alert.

## 2016-01-14 NOTE — Discharge Instructions (Signed)
Abdominal Pain, Adult °Many things can cause abdominal pain. Usually, abdominal pain is not caused by a disease and will improve without treatment. It can often be observed and treated at home. Your health care provider will do a physical exam and possibly order blood tests and X-rays to help determine the seriousness of your pain. However, in many cases, more time must pass before a clear cause of the pain can be found. Before that point, your health care provider may not know if you need more testing or further treatment. °HOME CARE INSTRUCTIONS °Monitor your abdominal pain for any changes. The following actions may help to alleviate any discomfort you are experiencing: °· Only take over-the-counter or prescription medicines as directed by your health care provider. °· Do not take laxatives unless directed to do so by your health care provider. °· Try a clear liquid diet (broth, tea, or water) as directed by your health care provider. Slowly move to a bland diet as tolerated. °SEEK MEDICAL CARE IF: °· You have unexplained abdominal pain. °· You have abdominal pain associated with nausea or diarrhea. °· You have pain when you urinate or have a bowel movement. °· You experience abdominal pain that wakes you in the night. °· You have abdominal pain that is worsened or improved by eating food. °· You have abdominal pain that is worsened with eating fatty foods. °· You have a fever. °SEEK IMMEDIATE MEDICAL CARE IF: °· Your pain does not go away within 2 hours. °· You keep throwing up (vomiting). °· Your pain is felt only in portions of the abdomen, such as the right side or the left lower portion of the abdomen. °· You pass bloody or black tarry stools. °MAKE SURE YOU: °· Understand these instructions. °· Will watch your condition. °· Will get help right away if you are not doing well or get worse. °  °This information is not intended to replace advice given to you by your health care provider. Make sure you discuss  any questions you have with your health care provider. °  °Document Released: 04/23/2005 Document Revised: 04/04/2015 Document Reviewed: 03/23/2013 °Elsevier Interactive Patient Education ©2016 Elsevier Inc. ° °Constipation, Adult °Constipation is when a person has fewer than three bowel movements a week, has difficulty having a bowel movement, or has stools that are dry, hard, or larger than normal. As people grow older, constipation is more common. A low-fiber diet, not taking in enough fluids, and taking certain medicines may make constipation worse.  °CAUSES  °· Certain medicines, such as antidepressants, pain medicine, iron supplements, antacids, and water pills.   °· Certain diseases, such as diabetes, irritable bowel syndrome (IBS), thyroid disease, or depression.   °· Not drinking enough water.   °· Not eating enough fiber-rich foods.   °· Stress or travel.   °· Lack of physical activity or exercise.   °· Ignoring the urge to have a bowel movement.   °· Using laxatives too much.   °SIGNS AND SYMPTOMS  °· Having fewer than three bowel movements a week.   °· Straining to have a bowel movement.   °· Having stools that are hard, dry, or larger than normal.   °· Feeling full or bloated.   °· Pain in the lower abdomen.   °· Not feeling relief after having a bowel movement.   °DIAGNOSIS  °Your health care provider will take a medical history and perform a physical exam. Further testing may be done for severe constipation. Some tests may include: °· A barium enema X-ray to examine your rectum, colon, and, sometimes,   your small intestine.   °· A sigmoidoscopy to examine your lower colon.   °· A colonoscopy to examine your entire colon. °TREATMENT  °Treatment will depend on the severity of your constipation and what is causing it. Some dietary treatments include drinking more fluids and eating more fiber-rich foods. Lifestyle treatments may include regular exercise. If these diet and lifestyle recommendations do not  help, your health care provider may recommend taking over-the-counter laxative medicines to help you have bowel movements. Prescription medicines may be prescribed if over-the-counter medicines do not work.  °HOME CARE INSTRUCTIONS  °· Eat foods that have a lot of fiber, such as fruits, vegetables, whole grains, and beans. °· Limit foods high in fat and processed sugars, such as french fries, hamburgers, cookies, candies, and soda.   °· A fiber supplement may be added to your diet if you cannot get enough fiber from foods.   °· Drink enough fluids to keep your urine clear or pale yellow.   °· Exercise regularly or as directed by your health care provider.   °· Go to the restroom when you have the urge to go. Do not hold it.   °· Only take over-the-counter or prescription medicines as directed by your health care provider. Do not take other medicines for constipation without talking to your health care provider first.   °SEEK IMMEDIATE MEDICAL CARE IF:  °· You have bright red blood in your stool.   °· Your constipation lasts for more than 4 days or gets worse.   °· You have abdominal or rectal pain.   °· You have thin, pencil-like stools.   °· You have unexplained weight loss. °MAKE SURE YOU:  °· Understand these instructions. °· Will watch your condition. °· Will get help right away if you are not doing well or get worse. °  °This information is not intended to replace advice given to you by your health care provider. Make sure you discuss any questions you have with your health care provider. °  °Document Released: 04/11/2004 Document Revised: 08/04/2014 Document Reviewed: 04/25/2013 °Elsevier Interactive Patient Education ©2016 Elsevier Inc. ° °Urinary Tract Infection °Urinary tract infections (UTIs) can develop anywhere along your urinary tract. Your urinary tract is your body's drainage system for removing wastes and extra water. Your urinary tract includes two kidneys, two ureters, a bladder, and a urethra.  Your kidneys are a pair of bean-shaped organs. Each kidney is about the size of your fist. They are located below your ribs, one on each side of your spine. °CAUSES °Infections are caused by microbes, which are microscopic organisms, including fungi, viruses, and bacteria. These organisms are so small that they can only be seen through a microscope. Bacteria are the microbes that most commonly cause UTIs. °SYMPTOMS  °Symptoms of UTIs may vary by age and gender of the patient and by the location of the infection. Symptoms in young women typically include a frequent and intense urge to urinate and a painful, burning feeling in the bladder or urethra during urination. Older women and men are more likely to be tired, shaky, and weak and have muscle aches and abdominal pain. A fever may mean the infection is in your kidneys. Other symptoms of a kidney infection include pain in your back or sides below the ribs, nausea, and vomiting. °DIAGNOSIS °To diagnose a UTI, your caregiver will ask you about your symptoms. Your caregiver will also ask you to provide a urine sample. The urine sample will be tested for bacteria and white blood cells. White blood cells are made by your   body to help fight infection. °TREATMENT  °Typically, UTIs can be treated with medication. Because most UTIs are caused by a bacterial infection, they usually can be treated with the use of antibiotics. The choice of antibiotic and length of treatment depend on your symptoms and the type of bacteria causing your infection. °HOME CARE INSTRUCTIONS °· If you were prescribed antibiotics, take them exactly as your caregiver instructs you. Finish the medication even if you feel better after you have only taken some of the medication. °· Drink enough water and fluids to keep your urine clear or pale yellow. °· Avoid caffeine, tea, and carbonated beverages. They tend to irritate your bladder. °· Empty your bladder often. Avoid holding urine for long periods of  time. °· Empty your bladder before and after sexual intercourse. °· After a bowel movement, women should cleanse from front to back. Use each tissue only once. °SEEK MEDICAL CARE IF:  °· You have back pain. °· You develop a fever. °· Your symptoms do not begin to resolve within 3 days. °SEEK IMMEDIATE MEDICAL CARE IF:  °· You have severe back pain or lower abdominal pain. °· You develop chills. °· You have nausea or vomiting. °· You have continued burning or discomfort with urination. °MAKE SURE YOU:  °· Understand these instructions. °· Will watch your condition. °· Will get help right away if you are not doing well or get worse. °  °This information is not intended to replace advice given to you by your health care provider. Make sure you discuss any questions you have with your health care provider. °  °Document Released: 04/23/2005 Document Revised: 04/04/2015 Document Reviewed: 08/22/2011 °Elsevier Interactive Patient Education ©2016 Elsevier Inc. ° °

## 2016-05-01 ENCOUNTER — Telehealth: Payer: Self-pay | Admitting: Family Medicine

## 2016-05-01 NOTE — Telephone Encounter (Signed)
I spoke with pts mom (DPR signed) and she thought pt [facility] at 2:45. I do  Not see appt at that time. pts mom gave me another # to call [phone]. I spoke with Analaura and she said she changed appt from today until 05/02/16 at 10 am with Harlin Heys Gessner FNP. Pt said she is not SI/HI. Pt is not taking med for depression. If pt condition changes or worsens prior to appt pt will go to ED.FYI to Harlin Heys Gessner FNP.

## 2016-05-01 NOTE — Telephone Encounter (Signed)
Patient Name: Martha DawleyCIARA Chappelle  DOB: 26-Dec-1990    Initial Comment Caller says, has major depression, she wants to sleep a lot. -- she has an appt for tomorrow at 10 am, but the appt line sent her to us due to her wording, just in case she needs to be seen sooner.    Nurse Assessment      Guidelines    Guideline Title Affirmed Question Affirmed Notes       Final Disposition User   FINAL ATTEMPT MADE - message left Stefano GaulStringer, Charity fundraiserN, Dwana CurdVera

## 2016-05-01 NOTE — Telephone Encounter (Signed)
Pt has appt with Harlin Heys Gessner FNP on 05/02/16 at 10 AM. Unable to reach pt for additional info.

## 2016-05-02 ENCOUNTER — Encounter: Payer: Self-pay | Admitting: Family Medicine

## 2016-05-02 ENCOUNTER — Ambulatory Visit: Payer: Self-pay | Admitting: Family Medicine

## 2016-05-02 ENCOUNTER — Ambulatory Visit (INDEPENDENT_AMBULATORY_CARE_PROVIDER_SITE_OTHER): Payer: BLUE CROSS/BLUE SHIELD | Admitting: Family Medicine

## 2016-05-02 VITALS — BP 100/70 | HR 60 | Wt 109.0 lb

## 2016-05-02 DIAGNOSIS — R350 Frequency of micturition: Secondary | ICD-10-CM

## 2016-05-02 DIAGNOSIS — R140 Abdominal distension (gaseous): Secondary | ICD-10-CM

## 2016-05-02 DIAGNOSIS — R7989 Other specified abnormal findings of blood chemistry: Secondary | ICD-10-CM

## 2016-05-02 DIAGNOSIS — N643 Galactorrhea not associated with childbirth: Secondary | ICD-10-CM

## 2016-05-02 DIAGNOSIS — R8290 Unspecified abnormal findings in urine: Secondary | ICD-10-CM

## 2016-05-02 DIAGNOSIS — R946 Abnormal results of thyroid function studies: Secondary | ICD-10-CM

## 2016-05-02 DIAGNOSIS — R635 Abnormal weight gain: Secondary | ICD-10-CM | POA: Diagnosis not present

## 2016-05-02 DIAGNOSIS — E031 Congenital hypothyroidism without goiter: Secondary | ICD-10-CM

## 2016-05-02 DIAGNOSIS — N3090 Cystitis, unspecified without hematuria: Secondary | ICD-10-CM

## 2016-05-02 DIAGNOSIS — F339 Major depressive disorder, recurrent, unspecified: Secondary | ICD-10-CM

## 2016-05-02 LAB — COMPREHENSIVE METABOLIC PANEL
ALT: 23 U/L (ref 0–35)
AST: 31 U/L (ref 0–37)
Albumin: 4.6 g/dL (ref 3.5–5.2)
Alkaline Phosphatase: 55 U/L (ref 39–117)
BUN: 15 mg/dL (ref 6–23)
CHLORIDE: 103 meq/L (ref 96–112)
CO2: 29 mEq/L (ref 19–32)
CREATININE: 1.12 mg/dL (ref 0.40–1.20)
Calcium: 9.3 mg/dL (ref 8.4–10.5)
GFR: 63.4 mL/min (ref >60.00)
GLUCOSE: 92 mg/dL (ref 70–99)
POTASSIUM: 4.2 meq/L (ref 3.5–5.1)
SODIUM: 139 meq/L (ref 135–145)
TOTAL PROTEIN: 8.1 g/dL (ref 6.0–8.3)
Total Bilirubin: 0.3 mg/dL (ref 0.2–1.2)

## 2016-05-02 LAB — CBC
HCT: 36.7 % (ref 36.0–46.0)
HEMOGLOBIN: 12.4 g/dL (ref 12.0–15.0)
MCHC: 33.7 g/dL (ref 30.0–36.0)
MCV: 96 fl (ref 78.0–100.0)
RBC: 3.82 Mil/uL — ABNORMAL LOW (ref 3.87–5.11)
RDW: 16.2 % — AB (ref 11.5–15.5)
WBC: 10.5 10*3/uL (ref 4.0–10.5)

## 2016-05-02 LAB — POC URINALSYSI DIPSTICK (AUTOMATED)
Bilirubin, UA: NEGATIVE
GLUCOSE UA: NEGATIVE
Ketones, UA: NEGATIVE
UROBILINOGEN UA: NEGATIVE
pH, UA: 6

## 2016-05-02 LAB — POCT URINE PREGNANCY: PREG TEST UR: NEGATIVE

## 2016-05-02 LAB — TSH: TSH: 98.02 u[IU]/mL — ABNORMAL HIGH (ref 0.35–4.50)

## 2016-05-02 MED ORDER — LEVOTHYROXINE SODIUM 200 MCG PO TABS: 200.0000 ug | ORAL_TABLET | Freq: Every day | ORAL | 0 refills | Status: DC
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
## 2016-05-03 LAB — PROLACTIN: Prolactin: 17.7 ng/mL

## 2016-05-05 LAB — URINE CULTURE

## 2016-05-06 ENCOUNTER — Telehealth: Payer: Self-pay | Admitting: Family Medicine

## 2016-05-06 NOTE — Telephone Encounter (Signed)
Pt called regarding her labs, specifically about thyroid Please call pt back

## 2016-05-07 ENCOUNTER — Ambulatory Visit
Admission: RE | Admit: 2016-05-07 | Discharge: 2016-05-07 | Disposition: A | Discharge status: Home / Self Care | Payer: BLUE CROSS/BLUE SHIELD | Source: Ambulatory Visit | Attending: Family Medicine | Admitting: Family Medicine

## 2016-05-07 DIAGNOSIS — R635 Abnormal weight gain: Secondary | ICD-10-CM | POA: Insufficient documentation

## 2016-05-07 DIAGNOSIS — R140 Abdominal distension (gaseous): Secondary | ICD-10-CM | POA: Insufficient documentation

## 2016-05-07 MED ORDER — LEVOTHYROXINE SODIUM 200 MCG PO TABS: 200.0000 ug | ORAL_TABLET | Freq: Every day | ORAL | 0 refills | Status: DC

## 2016-05-07 MED ORDER — CEPHALEXIN 500 MG PO CAPS: 500.0000 mg | ORAL_CAPSULE | Freq: Three times a day (TID) | ORAL | 0 refills | Status: DC

## 2016-05-07 NOTE — Addendum Note (Signed)
Addended by: Olean ReeGESSNER, Sahalie Beth B on: 05/07/2016 08:48 AM   Modules accepted: Orders

## 2016-05-07 NOTE — Telephone Encounter (Signed)
I have put in a lab result call for today to discuss her results.

## 2016-05-14 ENCOUNTER — Encounter: Payer: Self-pay | Admitting: Family Medicine

## 2016-05-19 ENCOUNTER — Encounter: Payer: Self-pay | Admitting: Internal Medicine

## 2016-05-19 ENCOUNTER — Ambulatory Visit (INDEPENDENT_AMBULATORY_CARE_PROVIDER_SITE_OTHER): Payer: BLUE CROSS/BLUE SHIELD | Admitting: Internal Medicine

## 2016-05-19 VITALS — BP 102/74 | HR 69 | Temp 98.0°F | Wt 103.8 lb

## 2016-05-19 DIAGNOSIS — R109 Unspecified abdominal pain: Secondary | ICD-10-CM

## 2016-05-19 LAB — POC URINALSYSI DIPSTICK (AUTOMATED)
BILIRUBIN UA: NEGATIVE
Blood, UA: NEGATIVE
Glucose, UA: NEGATIVE
KETONES UA: NEGATIVE
LEUKOCYTES UA: NEGATIVE
Nitrite, UA: POSITIVE
Spec Grav, UA: >=1.03
Urobilinogen, UA: NEGATIVE
pH, UA: 6

## 2016-05-19 MED ORDER — CIPROFLOXACIN HCL 500 MG PO TABS: 500.0000 mg | ORAL_TABLET | Freq: Two times a day (BID) | ORAL | 0 refills | Status: DC

## 2016-05-19 NOTE — Addendum Note (Signed)
Addended by: Roena MaladyEVONTENNO, Amoree Newlon Y on: 05/19/2016 04:22 PM   Modules accepted: Orders

## 2016-05-19 NOTE — Patient Instructions (Signed)
Flank Pain °Flank pain refers to pain that is located on the side of the body between the upper abdomen and the back. The pain may occur over a short period of time (acute) or may be long-term or reoccurring (chronic). It may be mild or severe. Flank pain can be caused by many things. °CAUSES  °Some of the more common causes of flank pain include: °· Muscle strains.   °· Muscle spasms.   °· A disease of your spine (vertebral disk disease).   °· A lung infection (pneumonia).   °· Fluid around your lungs (pulmonary edema).   °· A kidney infection.   °· Kidney stones.   °· A very painful skin rash caused by the chickenpox virus (shingles).   °· Gallbladder disease.   °HOME CARE INSTRUCTIONS  °Home care will depend on the cause of your pain. In general, °· Rest as directed by your caregiver. °· Drink enough fluids to keep your urine clear or pale yellow. °· Only take over-the-counter or prescription medicines as directed by your caregiver. Some medicines may help relieve the pain. °· Tell your caregiver about any changes in your pain. °· Follow up with your caregiver as directed. °SEEK IMMEDIATE MEDICAL CARE IF:  °· Your pain is not controlled with medicine.   °· You have new or worsening symptoms. °· Your pain increases.   °· You have abdominal pain.   °· You have shortness of breath.   °· You have persistent nausea or vomiting.   °· You have swelling in your abdomen.   °· You feel faint or pass out.   °· You have blood in your urine. °· You have a fever or persistent symptoms for more than 2-3 days. °· You have a fever and your symptoms suddenly get worse. °MAKE SURE YOU:  °· Understand these instructions. °· Will watch your condition. °· Will get help right away if you are not doing well or get worse. °  °This information is not intended to replace advice given to you by your health care provider. Make sure you discuss any questions you have with your health care provider. °  °Document Released: 09/04/2005 Document  Revised: 04/07/2012 Document Reviewed: 02/26/2012 °Elsevier Interactive Patient Education ©2016 [employer] Inc. ° °
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
## 2016-05-22 LAB — URINE CULTURE

## 2016-05-24 ENCOUNTER — Emergency Department
Admission: EM | Admit: 2016-05-24 | Discharge: 2016-05-24 | Disposition: A | Discharge status: Home / Self Care | Payer: BLUE CROSS/BLUE SHIELD | Attending: Emergency Medicine | Admitting: Emergency Medicine

## 2016-05-24 ENCOUNTER — Emergency Department: Payer: BLUE CROSS/BLUE SHIELD

## 2016-05-24 DIAGNOSIS — E031 Congenital hypothyroidism without goiter: Secondary | ICD-10-CM | POA: Diagnosis not present

## 2016-05-24 DIAGNOSIS — R1030 Lower abdominal pain, unspecified: Secondary | ICD-10-CM | POA: Diagnosis present

## 2016-05-24 DIAGNOSIS — N12 Tubulo-interstitial nephritis, not specified as acute or chronic: Secondary | ICD-10-CM | POA: Diagnosis not present

## 2016-05-24 DIAGNOSIS — Z79899 Other long term (current) drug therapy: Secondary | ICD-10-CM | POA: Diagnosis not present

## 2016-05-24 DIAGNOSIS — R109 Unspecified abdominal pain: Secondary | ICD-10-CM

## 2016-05-24 DIAGNOSIS — F17210 Nicotine dependence, cigarettes, uncomplicated: Secondary | ICD-10-CM | POA: Insufficient documentation

## 2016-05-24 LAB — URINALYSIS COMPLETE WITH MICROSCOPIC (ARMC ONLY)
Bilirubin Urine: NEGATIVE
GLUCOSE, UA: NEGATIVE mg/dL
NITRITE: POSITIVE — AB
Protein, ur: 100 mg/dL — AB
SPECIFIC GRAVITY, URINE: 1.026 (ref 1.005–1.030)
pH: 5 (ref 5.0–8.0)

## 2016-05-24 LAB — COMPREHENSIVE METABOLIC PANEL
ALBUMIN: 5 g/dL (ref 3.5–5.0)
ALK PHOS: 51 U/L (ref 38–126)
ALT: 12 U/L — AB (ref 14–54)
AST: 22 U/L (ref 15–41)
Anion gap: 7 (ref 5–15)
BILIRUBIN TOTAL: 0.7 mg/dL (ref 0.3–1.2)
BUN: 17 mg/dL (ref 6–20)
CALCIUM: 9.3 mg/dL (ref 8.9–10.3)
CO2: 27 mmol/L (ref 22–32)
Chloride: 107 mmol/L (ref 101–111)
Creatinine, Ser: 0.87 mg/dL (ref 0.44–1.00)
GFR calc Af Amer: >60 mL/min (ref >60)
GFR calc non Af Amer: >60 mL/min (ref >60)
GLUCOSE: 118 mg/dL — AB (ref 65–99)
Potassium: 3.9 mmol/L (ref 3.5–5.1)
Sodium: 141 mmol/L (ref 135–145)
TOTAL PROTEIN: 9 g/dL — AB (ref 6.5–8.1)

## 2016-05-24 LAB — CBC
HCT: 37.3 % (ref 35.0–47.0)
Hemoglobin: 13.2 g/dL (ref 12.0–16.0)
MCH: 34.6 pg — ABNORMAL HIGH (ref 26.0–34.0)
MCHC: 35.5 g/dL (ref 32.0–36.0)
MCV: 97.5 fL (ref 80.0–100.0)
Platelets: 245 10*3/uL (ref 150–440)
RBC: 3.82 MIL/uL (ref 3.80–5.20)
RDW: 15.2 % — AB (ref 11.5–14.5)
WBC: 10.9 10*3/uL (ref 3.6–11.0)

## 2016-05-24 LAB — POCT PREGNANCY, URINE: PREG TEST UR: NEGATIVE

## 2016-05-24 LAB — LIPASE, BLOOD: Lipase: 25 U/L (ref 11–51)

## 2016-05-24 MED ORDER — DEXTROSE 5 % IV SOLN: 1.0000 g | Freq: Once | INTRAVENOUS | Status: DC

## 2016-05-24 MED ORDER — SODIUM CHLORIDE 0.9 % IV SOLN
1000.0000 mL | Freq: Once | INTRAVENOUS | Status: AC
  Administered 2016-05-24: 1000 mL via INTRAVENOUS

## 2016-05-24 MED ORDER — TRAMADOL HCL 50 MG PO TABS: 50.0000 mg | ORAL_TABLET | Freq: Four times a day (QID) | ORAL | 0 refills | Status: DC | PRN

## 2016-05-24 MED ORDER — SULFAMETHOXAZOLE-TRIMETHOPRIM 800-160 MG PO TABS: 1.0000 | ORAL_TABLET | Freq: Two times a day (BID) | ORAL | 0 refills | Status: DC

## 2016-05-24 MED ORDER — KETOROLAC TROMETHAMINE 30 MG/ML IJ SOLN
30.0000 mg | Freq: Once | INTRAMUSCULAR | Status: AC
  Administered 2016-05-24: 30 mg via INTRAVENOUS

## 2016-05-24 MED ORDER — KETOROLAC TROMETHAMINE 30 MG/ML IJ SOLN
INTRAMUSCULAR | Status: AC
  Administered 2016-05-24: 30 mg via INTRAVENOUS
  Filled 2016-05-24: qty 1

## 2016-05-24 MED ORDER — ONDANSETRON HCL 4 MG/2ML IJ SOLN
4.0000 mg | Freq: Once | INTRAMUSCULAR | Status: AC
  Administered 2016-05-24: 4 mg via INTRAVENOUS
  Filled 2016-05-24: qty 2

## 2016-05-24 MED ORDER — CEFTRIAXONE SODIUM-DEXTROSE 1-3.74 GM-% IV SOLR
1.0000 g | Freq: Once | INTRAVENOUS | Status: AC
  Administered 2016-05-24: 1 g via INTRAVENOUS
  Filled 2016-05-24 (×2): qty 50

## 2016-05-24 MED ORDER — HYDROCODONE-ACETAMINOPHEN 5-325 MG PO TABS: 1.0000 | ORAL_TABLET | ORAL | 0 refills | Status: DC | PRN

## 2016-05-24 MED ORDER — MORPHINE SULFATE (PF) 2 MG/ML IV SOLN
4.0000 mg | Freq: Once | INTRAVENOUS | Status: AC
  Administered 2016-05-24: 4 mg via INTRAVENOUS
  Filled 2016-05-24: qty 2

## 2016-05-24 NOTE — ED Notes (Signed)
Spoke with MD regarding patient unchanging pain. Orders received.

## 2016-05-24 NOTE — ED Notes (Signed)
Pt alert and oriented X4, active, cooperative, pt in NAD. RR even and unlabored, color WNL.  Pt informed to return if any life threatening symptoms occur.   
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
## 2016-05-24 NOTE — ED Notes (Signed)
POCT urine resulted as negative

## 2016-05-24 NOTE — ED Notes (Signed)
MD at bedside. 

## 2016-05-24 NOTE — ED Triage Notes (Signed)
Pt reports lower back pain and lower abd pain for past 3 weeks. Pt also c/o burning with urination. Denies fever.

## 2016-05-24 NOTE — ED Notes (Signed)
Bilateral flank pain that radiates to bilateral lower abdomen. Pt states she is being treated for UTI with antibiotics currently. MD at bedside. Pt ambulated to room but appears to be in a lot of pain.

## 2016-05-24 NOTE — ED Notes (Signed)
Patient transported to CT 

## 2016-05-27 LAB — URINE CULTURE

## 2016-05-28 ENCOUNTER — Telehealth: Payer: Self-pay | Admitting: Pharmacist

## 2016-05-28 NOTE — ED Provider Notes (Signed)
-----------------------------------------   1:52 PM on 05/28/2016 -----------------------------------------  Discussed urine culture altered in person with Melissa the pharmacist.  The patient has greater than 100,000 colony-forming units of Escherichia coli that is sensitive to to cephalosporins as well as to Cipro, though the Cipro apparently was not successful in treating the patient.  The patient did previously take a course of Keflex but it was a lower dose.  Melissa will check with the patient's pharmacy to see if cefdinir would be affordable, if so she will call in a prescription, if the prescription would be cost prohibitive we will do pyelonephritis dosing of Keflex (500 mg 4 times a day for 12 days).  Melissa will call the patient home to update her and also encourage her to go to her urologist at the next available opportunity.   Loleta Roseory Kasee Hantz, MD 05/28/16 [phone]

## 2016-05-28 NOTE — Telephone Encounter (Signed)
Spoke with patient informed her that the bacteria growing in her urine cx was resistant to the antibiotic prescribed. Pt states she is still having pain. Requested a refill on her norco. Informed her that the ER Dr. could not refill, she should contact her primary care Dr. Rock NephewPt states she was taking Cipro prior to ER visit. Should have improved since it is sensitive. Will call in different abx since pt denies improvement. Plan was to call in cefdnir, but was about $30 on discount card. Per Dr. York CeriseForbach, keflex 500mg  QID x 12 days was prescribed ($13). Patient educated that medication was dosed QID and it was important to take that often as higher concentrations are need to get rid of bacteria. Also encouraged pt to see urologist, recommended Dr. Apolinar JunesBrandon, bc she had some kidney stones on scan and could be causing recurrent infections. Recommendation approved by Dr. York CeriseForbach. All pt questions answered.  Olene FlossMelissa D Rosemaria Inabinet, Pharm.D Clinical Pharmacist

## 2016-05-29 ENCOUNTER — Ambulatory Visit (INDEPENDENT_AMBULATORY_CARE_PROVIDER_SITE_OTHER): Payer: BLUE CROSS/BLUE SHIELD | Admitting: Family Medicine

## 2016-05-29 ENCOUNTER — Encounter: Payer: Self-pay | Admitting: *Deleted

## 2016-05-29 ENCOUNTER — Encounter: Payer: Self-pay | Admitting: Family Medicine

## 2016-05-29 ENCOUNTER — Emergency Department
Admission: EM | Admit: 2016-05-29 | Discharge: 2016-05-29 | Disposition: A | Discharge status: Home / Self Care | Payer: BLUE CROSS/BLUE SHIELD | Attending: Emergency Medicine | Admitting: Emergency Medicine

## 2016-05-29 VITALS — BP 126/80 | HR 104 | Temp 97.8°F | Ht 60.5 in | Wt 100.1 lb

## 2016-05-29 DIAGNOSIS — Z760 Encounter for issue of repeat prescription: Secondary | ICD-10-CM | POA: Insufficient documentation

## 2016-05-29 DIAGNOSIS — R109 Unspecified abdominal pain: Secondary | ICD-10-CM | POA: Diagnosis present

## 2016-05-29 DIAGNOSIS — N390 Urinary tract infection, site not specified: Secondary | ICD-10-CM | POA: Insufficient documentation

## 2016-05-29 DIAGNOSIS — R300 Dysuria: Secondary | ICD-10-CM | POA: Diagnosis not present

## 2016-05-29 DIAGNOSIS — E039 Hypothyroidism, unspecified: Secondary | ICD-10-CM | POA: Insufficient documentation

## 2016-05-29 DIAGNOSIS — F17210 Nicotine dependence, cigarettes, uncomplicated: Secondary | ICD-10-CM | POA: Diagnosis not present

## 2016-05-29 LAB — URINALYSIS COMPLETE WITH MICROSCOPIC (ARMC ONLY)
BILIRUBIN URINE: NEGATIVE
Bacteria, UA: NONE SEEN
Glucose, UA: NEGATIVE mg/dL
KETONES UR: NEGATIVE mg/dL
Leukocytes, UA: NEGATIVE
Nitrite: NEGATIVE
PH: 6 (ref 5.0–8.0)
Protein, ur: NEGATIVE mg/dL
Specific Gravity, Urine: 1.025 (ref 1.005–1.030)

## 2016-05-29 MED ORDER — HYDROCODONE-ACETAMINOPHEN 5-325 MG PO TABS: 1.0000 | ORAL_TABLET | ORAL | 0 refills | Status: DC | PRN

## 2016-05-29 NOTE — Discharge Instructions (Signed)
You need to follow-up with Dr. Dallas Schimkeopeland or the urologist listed on your  papers for follow-up of your urinary tract infection. Continue taking Keflex 4 times a day until completely finished. Increase fluids. Vicodin as needed for severe pain. Do not take this medication and drive or operate machinery.
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
## 2016-05-29 NOTE — ED Notes (Signed)
See triage note  States she is still having pain and needs additional pain meds  Had recent antibiotic change
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
## 2016-05-29 NOTE — Progress Notes (Signed)
Pre visit review using our clinic review tool, if applicable. No additional management support is needed unless otherwise documented below in the visit note. 

## 2016-05-29 NOTE — ED Triage Notes (Signed)
Pt was seen in ER for flank ,pain pt reports antibiotic was changed a few days ago, pt called PCP for refill of pain medication, MD would not refill Vicodin, pt here for mediation refill

## 2016-06-05 ENCOUNTER — Ambulatory Visit: Payer: Self-pay | Admitting: Licensed Clinical Social Worker

## 2016-06-11 ENCOUNTER — Emergency Department: Payer: BLUE CROSS/BLUE SHIELD

## 2016-06-11 ENCOUNTER — Encounter: Payer: Self-pay | Admitting: *Deleted

## 2016-06-11 ENCOUNTER — Emergency Department
Admission: EM | Admit: 2016-06-11 | Discharge: 2016-06-11 | Discharge status: Against Medical Advice | Payer: BLUE CROSS/BLUE SHIELD | Attending: Emergency Medicine | Admitting: Emergency Medicine

## 2016-06-11 DIAGNOSIS — R1030 Lower abdominal pain, unspecified: Secondary | ICD-10-CM | POA: Diagnosis present

## 2016-06-11 DIAGNOSIS — Z79899 Other long term (current) drug therapy: Secondary | ICD-10-CM | POA: Diagnosis not present

## 2016-06-11 DIAGNOSIS — F17210 Nicotine dependence, cigarettes, uncomplicated: Secondary | ICD-10-CM | POA: Diagnosis not present

## 2016-06-11 DIAGNOSIS — F1910 Other psychoactive substance abuse, uncomplicated: Secondary | ICD-10-CM | POA: Insufficient documentation

## 2016-06-11 DIAGNOSIS — E039 Hypothyroidism, unspecified: Secondary | ICD-10-CM | POA: Diagnosis not present

## 2016-06-11 LAB — URINALYSIS COMPLETE WITH MICROSCOPIC (ARMC ONLY)
Bacteria, UA: NONE SEEN
Bilirubin Urine: NEGATIVE
Glucose, UA: NEGATIVE mg/dL
Hgb urine dipstick: NEGATIVE
Leukocytes, UA: NEGATIVE
NITRITE: NEGATIVE
PH: 5 (ref 5.0–8.0)
PROTEIN: 30 mg/dL — AB
SPECIFIC GRAVITY, URINE: 1.02 (ref 1.005–1.030)

## 2016-06-11 LAB — CBC WITH DIFFERENTIAL/PLATELET
BASOS ABS: 0.1 10*3/uL (ref 0–0.1)
BASOS PCT: 1 %
EOS ABS: 0 10*3/uL (ref 0–0.7)
EOS PCT: 0 %
HCT: 36.5 % (ref 35.0–47.0)
Hemoglobin: 12.6 g/dL (ref 12.0–16.0)
Lymphocytes Relative: 39 %
Lymphs Abs: 3.6 10*3/uL (ref 1.0–3.6)
MCH: 33.7 pg (ref 26.0–34.0)
MCHC: 34.4 g/dL (ref 32.0–36.0)
MCV: 98 fL (ref 80.0–100.0)
MONO ABS: 0.5 10*3/uL (ref 0.2–0.9)
Monocytes Relative: 5 %
Neutro Abs: 5 10*3/uL (ref 1.4–6.5)
Neutrophils Relative %: 55 %
PLATELETS: 251 10*3/uL (ref 150–440)
RBC: 3.73 MIL/uL — ABNORMAL LOW (ref 3.80–5.20)
RDW: 13.7 % (ref 11.5–14.5)
WBC: 9.2 10*3/uL (ref 3.6–11.0)

## 2016-06-11 LAB — COMPREHENSIVE METABOLIC PANEL
ALBUMIN: 5 g/dL (ref 3.5–5.0)
ALT: 10 U/L — ABNORMAL LOW (ref 14–54)
AST: 20 U/L (ref 15–41)
Alkaline Phosphatase: 46 U/L (ref 38–126)
Anion gap: 11 (ref 5–15)
BUN: 25 mg/dL — AB (ref 6–20)
CHLORIDE: 103 mmol/L (ref 101–111)
CO2: 23 mmol/L (ref 22–32)
Calcium: 9.3 mg/dL (ref 8.9–10.3)
Creatinine, Ser: 0.76 mg/dL (ref 0.44–1.00)
GFR calc Af Amer: >60 mL/min (ref >60)
Glucose, Bld: 86 mg/dL (ref 65–99)
POTASSIUM: 2.9 mmol/L — AB (ref 3.5–5.1)
Sodium: 137 mmol/L (ref 135–145)
Total Bilirubin: 0.7 mg/dL (ref 0.3–1.2)
Total Protein: 8.7 g/dL — ABNORMAL HIGH (ref 6.5–8.1)

## 2016-06-11 LAB — URINE DRUG SCREEN, QUALITATIVE (ARMC ONLY)
Amphetamines, Ur Screen: POSITIVE — AB
Barbiturates, Ur Screen: NOT DETECTED
Benzodiazepine, Ur Scrn: POSITIVE — AB
CANNABINOID 50 NG, UR ~~LOC~~: POSITIVE — AB
COCAINE METABOLITE, UR ~~LOC~~: POSITIVE — AB
MDMA (ECSTASY) UR SCREEN: NOT DETECTED
Methadone Scn, Ur: NOT DETECTED
Opiate, Ur Screen: POSITIVE — AB
Phencyclidine (PCP) Ur S: NOT DETECTED
TRICYCLIC, UR SCREEN: NOT DETECTED

## 2016-06-11 MED ORDER — POTASSIUM CHLORIDE CRYS ER 20 MEQ PO TBCR
20.0000 meq | EXTENDED_RELEASE_TABLET | Freq: Once | ORAL | Status: AC
  Administered 2016-06-11: 20 meq via ORAL
  Filled 2016-06-11: qty 1

## 2016-06-11 MED ORDER — LIDOCAINE 5 % EX PTCH
1.0000 | MEDICATED_PATCH | CUTANEOUS | Status: DC
  Administered 2016-06-11: 1 via TRANSDERMAL
  Filled 2016-06-11: qty 1

## 2016-06-11 MED ORDER — LORAZEPAM 2 MG/ML IJ SOLN
1.0000 mg | Freq: Once | INTRAMUSCULAR | Status: AC
  Administered 2016-06-11: 1 mg via INTRAVENOUS
  Filled 2016-06-11: qty 1

## 2016-06-11 MED ORDER — ONDANSETRON HCL 4 MG/2ML IJ SOLN
4.0000 mg | Freq: Once | INTRAMUSCULAR | Status: AC
  Administered 2016-06-11: 4 mg via INTRAVENOUS
  Filled 2016-06-11: qty 2

## 2016-06-11 MED ORDER — KETOROLAC TROMETHAMINE 30 MG/ML IJ SOLN
15.0000 mg | Freq: Once | INTRAMUSCULAR | Status: AC
  Administered 2016-06-11: 15 mg via INTRAVENOUS
  Filled 2016-06-11: qty 1

## 2016-06-11 MED ORDER — KETOROLAC TROMETHAMINE 30 MG/ML IJ SOLN: 15.0000 mg | Freq: Once | INTRAMUSCULAR | Status: DC

## 2016-06-11 NOTE — ED Notes (Signed)
Pt given saltine crackers and ginger ale 

## 2016-06-11 NOTE — ED Notes (Signed)
Pt transported to US via stretcher.  

## 2016-06-11 NOTE — ED Notes (Signed)
Pt has refused to wait for discharge papers, refused additional VS or signature.  Pt has left with sister, pt ambulatory, resp even and unlabored.  NAD.

## 2016-06-11 NOTE — ED Notes (Signed)
Pt sleeping on stretcher on L side. Pt woken up and informed that all results are back and Dr. Huel CoteQuigley wants to go over them with her and her mom. Pt stated mom left ED and went home and that is why pt sister in ED. Pt sister not in room. Will call up to lobby to see if she is there.

## 2016-06-11 NOTE — ED Notes (Signed)
Pt family member at nurses desk, upset, very stern voice talking to Dr. Huel CoteQuigley. Dr. Huel CoteQuigley asking about who pt sees for her kidney stones, family member denying she sees anyone, stating  "who does she go to after hours when her kidney stones are moving" upset with Dr. Huel CoteQuigley stating "have you not looked at her chart." Dr. Huel CoteQuigley stated he can not take care of her without asking her questions. Pt family member remains upset and walked back to room.

## 2016-06-11 NOTE — ED Notes (Addendum)
ED Provider at bedside, going over pts results.  Family at bedside.  MD Donato SchultzQuiggley has reviewed drug screen w/ pt.  Pt sts that she has not taken anything at other than what she has been prescribed.

## 2016-06-11 NOTE — ED Notes (Signed)
Pt asleep, lights on.  No response when asked if she had any needs at this time.

## 2016-06-11 NOTE — ED Triage Notes (Addendum)
Pt to triage in wheelchair.  Pt sobbing, stating she has bilateral flank pain.  Hx of kidney stones.  Sx began last night and worsened today.
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
## 2016-06-14 LAB — URINE CULTURE

## 2016-06-15 NOTE — Progress Notes (Signed)
[place] [facility] [place] Amoxicillin 500 mg PO BID x 10 days. Authorizing MD: Grier RocherSchavietz, David  Jance Siek D. Wyllow Seigler, PharmD

## 2016-07-01 ENCOUNTER — Ambulatory Visit: Payer: BLUE CROSS/BLUE SHIELD | Admitting: Internal Medicine

## 2016-07-02 ENCOUNTER — Ambulatory Visit: Payer: Self-pay | Admitting: Licensed Clinical Social Worker

## 2016-07-14 ENCOUNTER — Ambulatory Visit (INDEPENDENT_AMBULATORY_CARE_PROVIDER_SITE_OTHER)
Admission: EM | Admit: 2016-07-14 | Discharge: 2016-07-14 | Disposition: A | Discharge status: Home / Self Care | Payer: BLUE CROSS/BLUE SHIELD | Source: Home / Self Care | Attending: Family Medicine | Admitting: Family Medicine

## 2016-07-14 ENCOUNTER — Encounter: Payer: Self-pay | Admitting: Emergency Medicine

## 2016-07-14 ENCOUNTER — Inpatient Hospital Stay: Payer: BLUE CROSS/BLUE SHIELD

## 2016-07-14 ENCOUNTER — Inpatient Hospital Stay
Admission: EM | Admit: 2016-07-14 | Discharge: 2016-07-18 | DRG: 872 | Disposition: A | Discharge status: Home / Self Care | Payer: BLUE CROSS/BLUE SHIELD | Attending: Internal Medicine | Admitting: Internal Medicine

## 2016-07-14 DIAGNOSIS — Z79899 Other long term (current) drug therapy: Secondary | ICD-10-CM | POA: Diagnosis not present

## 2016-07-14 DIAGNOSIS — Z62810 Personal history of physical and sexual abuse in childhood: Secondary | ICD-10-CM | POA: Diagnosis present

## 2016-07-14 DIAGNOSIS — F17210 Nicotine dependence, cigarettes, uncomplicated: Secondary | ICD-10-CM | POA: Diagnosis present

## 2016-07-14 DIAGNOSIS — F339 Major depressive disorder, recurrent, unspecified: Secondary | ICD-10-CM | POA: Diagnosis present

## 2016-07-14 DIAGNOSIS — N10 Acute tubulo-interstitial nephritis: Secondary | ICD-10-CM | POA: Diagnosis present

## 2016-07-14 DIAGNOSIS — Z818 Family history of other mental and behavioral disorders: Secondary | ICD-10-CM | POA: Diagnosis not present

## 2016-07-14 DIAGNOSIS — K5900 Constipation, unspecified: Secondary | ICD-10-CM | POA: Diagnosis present

## 2016-07-14 DIAGNOSIS — F410 Panic disorder [episodic paroxysmal anxiety] without agoraphobia: Secondary | ICD-10-CM | POA: Diagnosis present

## 2016-07-14 DIAGNOSIS — E031 Congenital hypothyroidism without goiter: Secondary | ICD-10-CM | POA: Diagnosis present

## 2016-07-14 DIAGNOSIS — F17200 Nicotine dependence, unspecified, uncomplicated: Secondary | ICD-10-CM | POA: Diagnosis present

## 2016-07-14 DIAGNOSIS — N12 Tubulo-interstitial nephritis, not specified as acute or chronic: Secondary | ICD-10-CM

## 2016-07-14 DIAGNOSIS — F411 Generalized anxiety disorder: Secondary | ICD-10-CM | POA: Diagnosis present

## 2016-07-14 DIAGNOSIS — N390 Urinary tract infection, site not specified: Secondary | ICD-10-CM

## 2016-07-14 DIAGNOSIS — E8770 Fluid overload, unspecified: Secondary | ICD-10-CM | POA: Diagnosis present

## 2016-07-14 DIAGNOSIS — N200 Calculus of kidney: Secondary | ICD-10-CM | POA: Diagnosis present

## 2016-07-14 DIAGNOSIS — Z91410 Personal history of adult physical and sexual abuse: Secondary | ICD-10-CM

## 2016-07-14 DIAGNOSIS — Z87440 Personal history of urinary (tract) infections: Secondary | ICD-10-CM | POA: Diagnosis not present

## 2016-07-14 DIAGNOSIS — R509 Fever, unspecified: Secondary | ICD-10-CM | POA: Diagnosis not present

## 2016-07-14 DIAGNOSIS — A419 Sepsis, unspecified organism: Secondary | ICD-10-CM | POA: Diagnosis not present

## 2016-07-14 DIAGNOSIS — F419 Anxiety disorder, unspecified: Secondary | ICD-10-CM | POA: Diagnosis present

## 2016-07-14 LAB — CBC WITH DIFFERENTIAL/PLATELET
BASOS ABS: 0 10*3/uL (ref 0–0.1)
BASOS PCT: 0 %
EOS ABS: 0.1 10*3/uL (ref 0–0.7)
EOS PCT: 0 %
HEMATOCRIT: 35.4 % (ref 35.0–47.0)
Hemoglobin: 11.8 g/dL — ABNORMAL LOW (ref 12.0–16.0)
Lymphocytes Relative: 6 %
Lymphs Abs: 1.1 10*3/uL (ref 1.0–3.6)
MCH: 33 pg (ref 26.0–34.0)
MCHC: 33.4 g/dL (ref 32.0–36.0)
MCV: 98.6 fL (ref 80.0–100.0)
MONO ABS: 0.9 10*3/uL (ref 0.2–0.9)
MONOS PCT: 5 %
Neutro Abs: 15.4 10*3/uL — ABNORMAL HIGH (ref 1.4–6.5)
Neutrophils Relative %: 89 %
PLATELETS: 260 10*3/uL (ref 150–440)
RBC: 3.59 MIL/uL — ABNORMAL LOW (ref 3.80–5.20)
RDW: 13.3 % (ref 11.5–14.5)
WBC: 17.5 10*3/uL — ABNORMAL HIGH (ref 3.6–11.0)

## 2016-07-14 LAB — RAPID INFLUENZA A&B ANTIGENS (ARMC ONLY): INFLUENZA A (ARMC): NEGATIVE

## 2016-07-14 LAB — COMPREHENSIVE METABOLIC PANEL
ALBUMIN: 3.1 g/dL — AB (ref 3.5–5.0)
ALT: 22 U/L (ref 14–54)
ANION GAP: 6 (ref 5–15)
AST: 30 U/L (ref 15–41)
Alkaline Phosphatase: 69 U/L (ref 38–126)
BILIRUBIN TOTAL: 0.3 mg/dL (ref 0.3–1.2)
BUN: 16 mg/dL (ref 6–20)
CHLORIDE: 101 mmol/L (ref 101–111)
CO2: 29 mmol/L (ref 22–32)
Calcium: 8 mg/dL — ABNORMAL LOW (ref 8.9–10.3)
Creatinine, Ser: 0.74 mg/dL (ref 0.44–1.00)
GFR calc Af Amer: >60 mL/min (ref >60)
GFR calc non Af Amer: >60 mL/min (ref >60)
GLUCOSE: 97 mg/dL (ref 65–99)
POTASSIUM: 3.4 mmol/L — AB (ref 3.5–5.1)
Sodium: 136 mmol/L (ref 135–145)
TOTAL PROTEIN: 6.7 g/dL (ref 6.5–8.1)

## 2016-07-14 LAB — URINALYSIS, COMPLETE (UACMP) WITH MICROSCOPIC
BILIRUBIN URINE: NEGATIVE
Glucose, UA: NEGATIVE mg/dL
KETONES UR: NEGATIVE mg/dL
NITRITE: POSITIVE — AB
Protein, ur: >300 mg/dL — AB
SPECIFIC GRAVITY, URINE: 1.015 (ref 1.005–1.030)
pH: 6 (ref 5.0–8.0)

## 2016-07-14 LAB — RAPID INFLUENZA A&B ANTIGENS: Influenza B (ARMC): NEGATIVE

## 2016-07-14 LAB — LACTIC ACID, PLASMA
LACTIC ACID, VENOUS: 1.1 mmol/L (ref 0.5–1.9)
LACTIC ACID, VENOUS: 1.4 mmol/L (ref 0.5–1.9)
Lactic Acid, Venous: 1.2 mmol/L (ref 0.5–1.9)

## 2016-07-14 LAB — APTT: aPTT: 36 seconds (ref 24–36)

## 2016-07-14 LAB — PROTIME-INR
INR: 1.17
PROTHROMBIN TIME: 15 s (ref 11.4–15.2)

## 2016-07-14 LAB — PROCALCITONIN: PROCALCITONIN: 0.45 ng/mL

## 2016-07-14 LAB — PREGNANCY, URINE: Preg Test, Ur: NEGATIVE

## 2016-07-14 LAB — MAGNESIUM: MAGNESIUM: 1.8 mg/dL (ref 1.7–2.4)

## 2016-07-14 LAB — RAPID STREP SCREEN (MED CTR MEBANE ONLY): STREPTOCOCCUS, GROUP A SCREEN (DIRECT): NEGATIVE

## 2016-07-14 MED ORDER — ACETAMINOPHEN 325 MG PO TABS
650.0000 mg | ORAL_TABLET | Freq: Four times a day (QID) | ORAL | Status: DC | PRN
  Administered 2016-07-15 – 2016-07-16 (×2): 650 mg via ORAL
  Filled 2016-07-14 (×2): qty 2

## 2016-07-14 MED ORDER — POTASSIUM CHLORIDE CRYS ER 20 MEQ PO TBCR
40.0000 meq | EXTENDED_RELEASE_TABLET | Freq: Once | ORAL | Status: AC
  Administered 2016-07-14: 40 meq via ORAL
  Filled 2016-07-14: qty 2

## 2016-07-14 MED ORDER — ACETAMINOPHEN 650 MG RE SUPP: 650.0000 mg | Freq: Four times a day (QID) | RECTAL | Status: DC | PRN

## 2016-07-14 MED ORDER — ENOXAPARIN SODIUM 40 MG/0.4ML ~~LOC~~ SOLN
40.0000 mg | SUBCUTANEOUS | Status: DC
  Administered 2016-07-14: 40 mg via SUBCUTANEOUS
  Filled 2016-07-14 (×3): qty 0.4

## 2016-07-14 MED ORDER — DEXTROSE 5 % IV SOLN: 1.0000 g | INTRAVENOUS | Status: DC

## 2016-07-14 MED ORDER — CEFTRIAXONE SODIUM-DEXTROSE 1-3.74 GM-% IV SOLR
INTRAVENOUS | Status: AC
  Administered 2016-07-14: 1 g via INTRAVENOUS
  Filled 2016-07-14: qty 50

## 2016-07-14 MED ORDER — ACETAMINOPHEN 325 MG PO TABS
650.0000 mg | ORAL_TABLET | Freq: Once | ORAL | Status: AC
  Administered 2016-07-14: 650 mg via ORAL

## 2016-07-14 MED ORDER — NICOTINE 14 MG/24HR TD PT24
14.0000 mg | MEDICATED_PATCH | Freq: Every day | TRANSDERMAL | Status: DC
  Administered 2016-07-14 – 2016-07-18 (×6): 14 mg via TRANSDERMAL
  Filled 2016-07-14 (×6): qty 1

## 2016-07-14 MED ORDER — ONDANSETRON HCL 4 MG/2ML IJ SOLN: 4.0000 mg | Freq: Four times a day (QID) | INTRAMUSCULAR | Status: DC | PRN

## 2016-07-14 MED ORDER — KETOROLAC TROMETHAMINE 30 MG/ML IJ SOLN
30.0000 mg | Freq: Four times a day (QID) | INTRAMUSCULAR | Status: DC | PRN
  Administered 2016-07-15: 30 mg via INTRAVENOUS
  Filled 2016-07-14: qty 1

## 2016-07-14 MED ORDER — KETOROLAC TROMETHAMINE 30 MG/ML IJ SOLN
INTRAMUSCULAR | Status: AC
  Administered 2016-07-14: 30 mg via INTRAVENOUS
  Filled 2016-07-14: qty 1

## 2016-07-14 MED ORDER — IBUPROFEN 600 MG PO TABS
600.0000 mg | ORAL_TABLET | Freq: Once | ORAL | Status: AC
  Administered 2016-07-14: 600 mg via ORAL

## 2016-07-14 MED ORDER — HYDROCODONE-ACETAMINOPHEN 5-325 MG PO TABS
ORAL_TABLET | ORAL | Status: AC
  Administered 2016-07-14: 2 via ORAL
  Filled 2016-07-14: qty 2

## 2016-07-14 MED ORDER — KETOROLAC TROMETHAMINE 30 MG/ML IJ SOLN
30.0000 mg | Freq: Once | INTRAMUSCULAR | Status: AC
  Administered 2016-07-14: 30 mg via INTRAVENOUS

## 2016-07-14 MED ORDER — CEFTRIAXONE SODIUM-DEXTROSE 1-3.74 GM-% IV SOLR
1.0000 g | Freq: Once | INTRAVENOUS | Status: AC
  Administered 2016-07-14: 1 g via INTRAVENOUS

## 2016-07-14 MED ORDER — ALBUTEROL SULFATE (2.5 MG/3ML) 0.083% IN NEBU: 2.5000 mg | INHALATION_SOLUTION | RESPIRATORY_TRACT | Status: DC | PRN

## 2016-07-14 MED ORDER — DEXTROSE 5 % IV SOLN
2.0000 g | Freq: Once | INTRAVENOUS | Status: DC
  Filled 2016-07-14: qty 2

## 2016-07-14 MED ORDER — IBUPROFEN 600 MG PO TABS
ORAL_TABLET | ORAL | Status: AC
  Administered 2016-07-14: 600 mg via ORAL
  Filled 2016-07-14: qty 1

## 2016-07-14 MED ORDER — SODIUM CHLORIDE 0.9 % IV SOLN
1000.0000 mL | Freq: Once | INTRAVENOUS | Status: AC
  Administered 2016-07-14: 1000 mL via INTRAVENOUS

## 2016-07-14 MED ORDER — CEFTRIAXONE SODIUM-DEXTROSE 1-3.74 GM-% IV SOLR
1.0000 g | INTRAVENOUS | Status: DC
  Administered 2016-07-15 – 2016-07-17 (×3): 1 g via INTRAVENOUS
  Filled 2016-07-14 (×3): qty 50

## 2016-07-14 MED ORDER — LEVOTHYROXINE SODIUM 200 MCG PO TABS
200.0000 ug | ORAL_TABLET | Freq: Every day | ORAL | Status: DC
  Administered 2016-07-15 – 2016-07-18 (×4): 200 ug via ORAL
  Filled 2016-07-14 (×4): qty 1

## 2016-07-14 MED ORDER — POTASSIUM CHLORIDE IN NACL 40-0.9 MEQ/L-% IV SOLN
INTRAVENOUS | Status: DC
  Administered 2016-07-14 – 2016-07-15 (×2): 125 mL/h via INTRAVENOUS
  Filled 2016-07-14 (×6): qty 1000

## 2016-07-14 MED ORDER — ONDANSETRON HCL 4 MG PO TABS
4.0000 mg | ORAL_TABLET | Freq: Four times a day (QID) | ORAL | Status: DC | PRN
Start: 2016-07-14 — End: 2016-07-18

## 2016-07-14 MED ORDER — HYDROCODONE-ACETAMINOPHEN 5-325 MG PO TABS
1.0000 | ORAL_TABLET | ORAL | Status: DC | PRN
  Administered 2016-07-14 (×2): 2 via ORAL
  Administered 2016-07-15: 1 via ORAL
  Administered 2016-07-15 (×4): 2 via ORAL
  Administered 2016-07-16: 1 via ORAL
  Administered 2016-07-16 (×2): 2 via ORAL
  Administered 2016-07-16 (×2): 1 via ORAL
  Administered 2016-07-17 – 2016-07-18 (×5): 2 via ORAL
  Filled 2016-07-14 (×5): qty 2
  Filled 2016-07-14: qty 1
  Filled 2016-07-14 (×2): qty 2
  Filled 2016-07-14: qty 1
  Filled 2016-07-14 (×6): qty 2
  Filled 2016-07-14: qty 1
  Filled 2016-07-14: qty 2

## 2016-07-14 MED ORDER — DEXTROSE 5 % IV SOLN: 1.0000 g | Freq: Once | INTRAVENOUS | Status: DC
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
## 2016-07-14 NOTE — ED Triage Notes (Signed)
Lower abdominal pain x 3 months, has taken 4 courses of antibiotics over past 3 months without marked improvement. Increasing pain, fever and weakness x 4 days.
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
## 2016-07-14 NOTE — ED Triage Notes (Signed)
Patient complains of fatigue, dizziness, fevers x 4 days. Patient states that she has also been having stomach burning with drinking. Patient states that she has been having dysuria as well off and on. Patient reports that she has been nausea and vomiting with eating.

## 2016-07-14 NOTE — Progress Notes (Signed)
Pharmacy Antibiotic Note  Martha Proctor is a 25 y.o. female admitted on 07/14/2016 with UTI.  Pharmacy has been consulted for Ceftriaxone dosing. Hx: recurrent UTIs  Plan: Patient received Ceftriaxone 1 gm IV x 1 in ER.  Will continue with Ceftriaxone 1 gm IV q24h.  Recent Urine cxs: Enterococcus+ 06/11/16  and E.coli + 05/24/16, 05/19/16 and 10/6 17.  E.coli has been Sensitive to CTX in these past cultures.     Temp (24hrs), Avg:100.2 F (37.9 C), Min:98 F (36.7 C), Max:102.4 F (39.1 C)   Recent Labs Lab 07/14/16 1113  LATICACIDVEN 1.1    CrCl cannot be calculated (Patient's most recent lab result is older than the maximum 21 days allowed.).    No Known Allergies  Antimicrobials this admission: CTX 12/18 >>       >>    Dose adjustments this admission:    Microbiology results: 12/18 BCx: Pending 12/18 UCx: Pending    Sputum:      MRSA PCR:    Thank you for allowing pharmacy to be a part of this patient's care.  Christien Frankl A 07/14/2016 1:58 PM

## 2016-07-15 ENCOUNTER — Inpatient Hospital Stay: Payer: BLUE CROSS/BLUE SHIELD

## 2016-07-15 LAB — BASIC METABOLIC PANEL
Anion gap: 5 (ref 5–15)
BUN: 17 mg/dL (ref 6–20)
CALCIUM: 7.9 mg/dL — AB (ref 8.9–10.3)
CO2: 27 mmol/L (ref 22–32)
CREATININE: 0.64 mg/dL (ref 0.44–1.00)
Chloride: 107 mmol/L (ref 101–111)
GFR calc non Af Amer: >60 mL/min (ref >60)
Glucose, Bld: 105 mg/dL — ABNORMAL HIGH (ref 65–99)
Potassium: 4.7 mmol/L (ref 3.5–5.1)
SODIUM: 139 mmol/L (ref 135–145)

## 2016-07-15 LAB — CBC
HCT: 34.9 % — ABNORMAL LOW (ref 35.0–47.0)
Hemoglobin: 12 g/dL (ref 12.0–16.0)
MCH: 33.6 pg (ref 26.0–34.0)
MCHC: 34.5 g/dL (ref 32.0–36.0)
MCV: 97.6 fL (ref 80.0–100.0)
Platelets: 286 10*3/uL (ref 150–440)
RBC: 3.58 MIL/uL — AB (ref 3.80–5.20)
RDW: 13 % (ref 11.5–14.5)
WBC: 22.5 10*3/uL — AB (ref 3.6–11.0)

## 2016-07-15 MED ORDER — DIPHENHYDRAMINE HCL 50 MG/ML IJ SOLN
25.0000 mg | Freq: Once | INTRAMUSCULAR | Status: AC
  Administered 2016-07-15: 25 mg via INTRAVENOUS
  Filled 2016-07-15: qty 1

## 2016-07-15 MED ORDER — SODIUM CHLORIDE 0.9 % IV SOLN
INTRAVENOUS | Status: DC
  Administered 2016-07-15: 16:00:00 via INTRAVENOUS
  Administered 2016-07-16: 1000 mL via INTRAVENOUS

## 2016-07-15 MED ORDER — PHENAZOPYRIDINE HCL 100 MG PO TABS
100.0000 mg | ORAL_TABLET | Freq: Three times a day (TID) | ORAL | Status: DC
  Administered 2016-07-15 – 2016-07-17 (×8): 100 mg via ORAL
  Filled 2016-07-15 (×9): qty 1

## 2016-07-15 NOTE — Progress Notes (Signed)
Pt's mother now at bedside. This Clinical research associatewriter explained to her pt's plan of care thus far. Pt received  Iv toradol for c/o unrelieved discomfort. After this, pt c/o iv iste burning, this subsided after approx 40 seconds. Pt had been crying. This Clinical research associatewriter then left the room to obtain vs machine. Pt's mother came out into the hallway yelling for help, stated pt could not breathe. This Clinical research associatewriter entered the room and found pt sitting upright in bed, able tospeak in full sentences and complaining that her throat was closing up. This Clinical research associatewriter checked pt's o2 sat, it was 100%, no stridor heard, no obvious edema to pt's face or neck, no rash noted. Pt placed on o2 at 2lnc for comfort, pt claimed she was unable to swallow water due to the swelling she felt. Dr. Luberta RobertsonPaged.

## 2016-07-15 NOTE — Progress Notes (Signed)
Pt received iv benadryl at this time. Pt appears calmer, confirmed that she cannot swallow but was on the phone ordering dinner while receiving the benadryl. Pt in no distress.

## 2016-07-15 NOTE — Progress Notes (Signed)
[employer] -  at [facility]                                                                                                                                                                                  Patient Demographics   Martha Proctor, is a 25 y.o. female, DOB - 05-21-1991, ZOX:[id number]  Admit date - 07/14/2016   Admitting Physician Shaune Pollack, MD  Outpatient Primary MD for the patient is [name], MD   LOS - 1  Subjective:  Patient admitted with sepsis states that she was seen in the ER within the past 3 months and was prescribed antibiotics and then 2 weeks later was told that she needed to take some different antibiotics she is very upset that had to be admitted again. Complains of pain in her flank area   Review of Systems:   CONSTITUTIONAL: No documented fever. No fatigue, weakness. No weight gain, no weight loss.  EYES: No blurry or double vision.  ENT: No tinnitus. No postnasal drip. No redness of the oropharynx.  RESPIRATORY: No cough, no wheeze, no hemoptysis. No dyspnea.  CARDIOVASCULAR: No chest pain. No orthopnea. No palpitations. No syncope.  GASTROINTESTINAL: No nausea, no vomiting or diarrhea. No abdominal pain. No melena or hematochezia.  GENITOURINARY: No dysuria or hematuria.  ENDOCRINE: No polyuria or nocturia. No heat or cold intolerance.  HEMATOLOGY: No anemia. No bruising. No bleeding.  INTEGUMENTARY: No rashes. No lesions.  MUSCULOSKELETAL: No arthritis. No swelling. No gout. Positive flank pain NEUROLOGIC: No numbness, tingling, or ataxia. No seizure-type activity.  PSYCHIATRIC: No anxiety. No insomnia. No ADD.    Vitals:   Vitals:   07/14/16 2022 07/15/16 0015 07/15/16 0151 07/15/16 0552  BP:    (!) 96/54  Pulse:    69  Resp:    18  Temp: 97.8 F (36.6 C) 97.7 F (36.5 C)  97.7 F (36.5 C)  TempSrc: Oral Oral  Oral  SpO2:    99%  Weight:   105 lb (47.6 kg)   Height:   5' (1.524 m)     Wt Readings from  Last 3 Encounters:  07/15/16 105 lb (47.6 kg)  07/14/16 105 lb (47.6 kg)  06/11/16 113 lb (51.3 kg)     Intake/Output Summary (Last 24 hours) at 07/15/16 1433 Last data filed at 07/15/16 1300  Gross per 24 hour  Intake              240 ml  Output                0 ml  Net  240 ml    Physical Exam:   GENERAL: Pleasant-appearing in no apparent distress.  HEAD, EYES, EARS, NOSE AND THROAT: Atraumatic, normocephalic. Extraocular muscles are intact. Pupils equal and reactive to light. Sclerae anicteric. No conjunctival injection. No oro-pharyngeal erythema.  NECK: Supple. There is no jugular venous distention. No bruits, no lymphadenopathy, no thyromegaly.  HEART: Regular rate and rhythm,. No murmurs, no rubs, no clicks.  LUNGS: Clear to auscultation bilaterally. No rales or rhonchi. No wheezes.  ABDOMEN: Soft, flat, nontender, nondistended. Has good bowel sounds. No hepatosplenomegaly appreciated.  EXTREMITIES: No evidence of any cyanosis, clubbing, or peripheral edema.  +2 pedal and radial pulses bilaterally.  NEUROLOGIC: The patient is alert, awake, and oriented x3 with no focal motor or sensory deficits appreciated bilaterally.  SKIN: Moist and warm with no rashes appreciated.  Psych: Not anxious, depressed LN: No inguinal LN enlargement    Antibiotics   Anti-infectives    Start     Dose/Rate Route Frequency Ordered Stop   07/15/16 1000  cefTRIAXone (ROCEPHIN) IVPB 1 g     1 g 100 mL/hr over 30 Minutes Intravenous Every 24 hours 07/14/16 1923     07/15/16 0000  cefTRIAXone (ROCEPHIN) 2 g in dextrose 5 % 50 mL IVPB  Status:  Discontinued     2 g 100 mL/hr over 30 Minutes Intravenous  Once 07/14/16 1355 07/14/16 1356   07/14/16 1915  cefTRIAXone (ROCEPHIN) 1 g in dextrose 5 % 50 mL IVPB  Status:  Discontinued     1 g 100 mL/hr over 30 Minutes Intravenous Every 24 hours 07/14/16 1904 07/14/16 1923   07/14/16 1230  cefTRIAXone (ROCEPHIN) IVPB 1 g     1 g 100 mL/hr  over 30 Minutes Intravenous  Once 07/14/16 1217 07/14/16 1248   07/14/16 1215  cefTRIAXone (ROCEPHIN) 1 g in dextrose 5 % 50 mL IVPB  Status:  Discontinued     1 g 100 mL/hr over 30 Minutes Intravenous  Once 07/14/16 1208 07/14/16 1216      Medications   Scheduled Meds: . cefTRIAXone  1 g Intravenous Q24H  . enoxaparin (LOVENOX) injection  40 mg Subcutaneous Q24H  . levothyroxine  200 mcg Oral Daily  . nicotine  14 mg Transdermal Daily  . phenazopyridine  100 mg Oral TID WC   Continuous Infusions: . 0.9 % NaCl with KCl 40 mEq / L 125 mL/hr (07/15/16 0954)   PRN Meds:.acetaminophen **OR** acetaminophen, albuterol, HYDROcodone-acetaminophen, ketorolac, ondansetron **OR** ondansetron (ZOFRAN) IV   Data Review:   Micro Results Recent Results (from the past 240 hour(s))  Rapid strep screen     Status: None   Collection Time: 07/14/16  8:46 AM  Result Value Ref Range Status   Streptococcus, Group A Screen (Direct) NEGATIVE NEGATIVE Final    Comment: (NOTE) A Rapid Antigen test may result negative if the antigen level in the sample is below the detection level of this test. The FDA has not cleared this test as a stand-alone test therefore the rapid antigen negative result has reflexed to a Group A Strep culture.   Rapid Influenza A&B Antigens ([facility] only)     Status: None   Collection Time: 07/14/16  8:46 AM  Result Value Ref Range Status   Influenza A ([facility]) NEGATIVE NEGATIVE Final   Influenza B ([facility]) NEGATIVE NEGATIVE Final  Culture, group A strep     Status: None (Preliminary result)   Collection Time: 07/14/16  8:46 AM  Result Value Ref Range Status  Specimen Description THROAT  Final   Special Requests NONE Reflexed from [id number]  Final   Culture   Final    CULTURE REINCUBATED FOR BETTER GROWTH [facility]    Report Status PENDING  Incomplete  Blood culture (routine x 2)     Status: None (Preliminary result)   Collection Time: 07/14/16 11:13 AM   Result Value Ref Range Status   Specimen Description BLOOD LEFT AC  Final   Special Requests   Final    BOTTLES DRAWN AEROBIC AND ANAEROBIC AER 8ML ANA 9ML   Culture NO GROWTH < 24 HOURS  Final   Report Status PENDING  Incomplete  Culture, blood (x 2)     Status: None (Preliminary result)   Collection Time: 07/14/16  7:19 PM  Result Value Ref Range Status   Specimen Description BLOOD RTHAND  Final   Special Requests BOTTLES DRAWN AEROBIC AND ANAEROBIC 8CC  Final   Culture NO GROWTH < 12 HOURS  Final   Report Status PENDING  Incomplete    Radiology Reports Dg Chest Port 1 View  Result Date: 07/14/2016 CLINICAL DATA:  fever, chills, dysuria and lower abdominal pain and left-sided flank pain for the past 2 days. She has a long history of UTI and kidney infection. Urinalysis show UTI Hx congenital hypothyroidism, thyroid disease, smoker, birth control implant EXAM: PORTABLE CHEST - 1 VIEW COMPARISON:  10/25/2014 FINDINGS: Lungs are clear. Heart size and mediastinal contours are within normal limits. No effusion. Visualized bones unremarkable. IMPRESSION: No acute cardiopulmonary disease. Electronically Signed   By: Corlis Leak  Hassell M.D.   On: 07/14/2016 19:50     CBC  Recent Labs Lab 07/14/16 1919 07/15/16 0526  WBC 17.5* 22.5*  HGB 11.8* 12.0  HCT 35.4 34.9*  PLT 260 286  MCV 98.6 97.6  MCH 33.0 33.6  MCHC 33.4 34.5  RDW 13.3 13.0  LYMPHSABS 1.1  --   MONOABS 0.9  --   EOSABS 0.1  --   BASOSABS 0.0  --     Chemistries   Recent Labs Lab 07/14/16 1919 07/15/16 0526  NA 136 139  K 3.4* 4.7  CL 101 107  CO2 29 27  GLUCOSE 97 105*  BUN 16 17  CREATININE 0.74 0.64  CALCIUM 8.0* 7.9*  MG 1.8  --   AST 30  --   ALT 22  --   ALKPHOS 69  --   BILITOT 0.3  --    ------------------------------------------------------------------------------------------------------------------ estimated creatinine clearance is 77.9 mL/min (by C-G formula based on SCr of 0.64  mg/dL). ------------------------------------------------------------------------------------------------------------------ No results for input(s): HGBA1C in the last 72 hours. ------------------------------------------------------------------------------------------------------------------ No results for input(s): CHOL, HDL, LDLCALC, TRIG, CHOLHDL, LDLDIRECT in the last 72 hours. ------------------------------------------------------------------------------------------------------------------ No results for input(s): TSH, T4TOTAL, T3FREE, THYROIDAB in the last 72 hours.  Invalid input(s): FREET3 ------------------------------------------------------------------------------------------------------------------ No results for input(s): VITAMINB12, FOLATE, FERRITIN, TIBC, IRON, RETICCTPCT in the last 72 hours.  Coagulation profile  Recent Labs Lab 07/14/16 1919  INR 1.17    No results for input(s): DDIMER in the last 72 hours.  Cardiac Enzymes No results for input(s): CKMB, TROPONINI, MYOGLOBIN in the last 168 hours.  Invalid input(s): CK ------------------------------------------------------------------------------------------------------------------ Invalid input(s): POCBNP    Assessment & Plan  Patient is a 25 year old admitted with acute pyelonephritis 1. Sepsis due to pyelonephritis. WBC has increased. I will continue Rocephin for now. Due to recurrent UTIs I'll ask infectious disease evaluation. I will do CT per renal protocol to further evaluate,  patient may need urology input   2. Nephrolithiasis. Nonobstructive, stable.  3.Tobacco abuse. Smoking cessation was provided on admission      Code Status Orders        Start     Ordered   07/14/16 1905  Full code  Continuous     07/14/16 1904    Code Status History    Date Active Date Inactive Code Status Order ID Comments User Context   This patient has a current code status but no historical code status.            Consults  ID  DVT Prophylaxis  Lovenox  Lab Results  Component Value Date   PLT 286 07/15/2016     Time Spent in minutes 35 minutes Greater than 50% of time spent in care coordination and counseling patient regarding the condition and plan of care.   Auburn Bilberry M.D on 07/15/2016 at 2:33 PM  Between 7am to 6pm - Pager - [phone]  After 6pm go to [email or web address] - password [email or web address] [facility]  [facility]   Office  [phone]

## 2016-07-15 NOTE — Progress Notes (Signed)
Pt rang call bell and told this writer that she woke feeling chills but her body feels hot. Oral temp of 99.1, pt received vicodin po for concurrent complaint of back pain as well. IVF changed per md order. This Clinical research associatewriter informed pt of need for CT scan to check for renal stones, pt unable to tolerate at this time. Pt left iwht ice water and call bell in reach.

## 2016-07-15 NOTE — Progress Notes (Signed)
Dr. Allena KatzPatel returned page. Received verbal order to d/c toradol and give pt benadryl 25mg  ivp x1.

## 2016-07-15 NOTE — Progress Notes (Signed)
Patient complaining of bladder spasms when trying to urinate. MD notified. Orders pending

## 2016-07-15 NOTE — Progress Notes (Signed)
Ted hose removed as patient was complaining of being hot and they were bothering her.

## 2016-07-15 NOTE — Progress Notes (Signed)
Pt temp recheck is now 101.5, pt received tylenol po. Pt chills have stopped, pt is flushed and states that she doesn't feel good. Pt stated that her mother wanted to speak to the doctor, asked if this writer would speak to her instead. Pt attempted to call her mother with this writer in the room, was unable to reach her.

## 2016-07-15 NOTE — Plan of Care (Signed)
Problem: Bowel/Gastric: Goal: Will not experience complications related to bowel motility Outcome: Progressing Pt remains moderately febrile intermittently, has remained free of falls/injury this shift. CT scan is pending, pt unable to complete at this time.

## 2016-07-15 NOTE — Progress Notes (Signed)
This Clinical research associatewriter made initial attempt to see pt at 0715, this Clinical research associatewriter was told by pt come back in an hour because she hasn't had any sleep.this Clinical research associatewriter returned at 0800 and explained to pt it was time for her pyridium. Pt complaining again that everyone has bothered her since 0530 ,and there is no need, the only thing she needs is ivf and antibiotics. This Clinical research associatewriter explained that because this is a hospital, there is a certain baseline of care that is delivered, pt stated "then just discharge me then so I can go home with some quiet". This Clinical research associatewriter stated that pt is free to request discharge from MD when rounds are made if she does not wish to stay. Pt accepted the pyridium. Call bell in reach. Pt was willing to place a breakfast order with the kitchen.

## 2016-07-16 LAB — BASIC METABOLIC PANEL
Anion gap: 5 (ref 5–15)
BUN: 10 mg/dL (ref 6–20)
CALCIUM: 7.8 mg/dL — AB (ref 8.9–10.3)
CO2: 25 mmol/L (ref 22–32)
Chloride: 106 mmol/L (ref 101–111)
Creatinine, Ser: 0.58 mg/dL (ref 0.44–1.00)
GFR calc Af Amer: >60 mL/min (ref >60)
GLUCOSE: 113 mg/dL — AB (ref 65–99)
Potassium: 3.9 mmol/L (ref 3.5–5.1)
Sodium: 136 mmol/L (ref 135–145)

## 2016-07-16 LAB — CULTURE, GROUP A STREP (THRC)

## 2016-07-16 LAB — CBC
HCT: 32.4 % — ABNORMAL LOW (ref 35.0–47.0)
Hemoglobin: 11 g/dL — ABNORMAL LOW (ref 12.0–16.0)
MCH: 33.2 pg (ref 26.0–34.0)
MCHC: 33.9 g/dL (ref 32.0–36.0)
MCV: 97.9 fL (ref 80.0–100.0)
PLATELETS: 293 10*3/uL (ref 150–440)
RBC: 3.31 MIL/uL — ABNORMAL LOW (ref 3.80–5.20)
RDW: 13.4 % (ref 11.5–14.5)
WBC: 18.2 10*3/uL — AB (ref 3.6–11.0)

## 2016-07-16 LAB — RAPID HIV SCREEN (HIV 1/2 AB+AG)
HIV 1/2 Antibodies: NONREACTIVE
HIV-1 P24 Antigen - HIV24: NONREACTIVE

## 2016-07-16 MED ORDER — ZOLPIDEM TARTRATE 5 MG PO TABS
5.0000 mg | ORAL_TABLET | Freq: Every day | ORAL | Status: DC
  Administered 2016-07-16 – 2016-07-17 (×2): 5 mg via ORAL
  Filled 2016-07-16 (×2): qty 1

## 2016-07-16 MED ORDER — SENNA 8.6 MG PO TABS
1.0000 | ORAL_TABLET | Freq: Every day | ORAL | Status: DC
  Administered 2016-07-16 – 2016-07-18 (×3): 8.6 mg via ORAL
  Filled 2016-07-16 (×3): qty 1

## 2016-07-16 MED ORDER — DOCUSATE SODIUM 100 MG PO CAPS
200.0000 mg | ORAL_CAPSULE | Freq: Two times a day (BID) | ORAL | Status: DC
  Administered 2016-07-16 – 2016-07-18 (×6): 200 mg via ORAL
  Filled 2016-07-16 (×5): qty 2

## 2016-07-16 MED ORDER — POLYETHYLENE GLYCOL 3350 17 G PO PACK
17.0000 g | PACK | Freq: Every day | ORAL | Status: DC
  Administered 2016-07-16: 17 g via ORAL
  Filled 2016-07-16: qty 1

## 2016-07-16 MED ORDER — FUROSEMIDE 10 MG/ML IJ SOLN
20.0000 mg | Freq: Two times a day (BID) | INTRAMUSCULAR | Status: DC
  Administered 2016-07-16 – 2016-07-17 (×2): 20 mg via INTRAVENOUS
  Filled 2016-07-16 (×2): qty 2
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
## 2016-07-16 NOTE — Progress Notes (Signed)
[employer] - [place] [facility]                                                                                                                                                                                  Patient Demographics   Martha DawleyCiara Proctor, is a 25 y.o. female, DOB - 08/02/1990, ZOX:[id number]  Admit date - 07/14/2016   Admitting Physician Shaune PollackQing Chen, MD  Outpatient Primary MD for the patient is Hannah BeatSpencer Copland, MD   LOS - 2  Subjective:  Continues to complain of plane in the flank region. Complains of abdominal distention  Review of Systems:   CONSTITUTIONAL: No documented fever. No fatigue, weakness. No weight gain, no weight loss.  EYES: No blurry or double vision.  ENT: No tinnitus. No postnasal drip. No redness of the oropharynx.  RESPIRATORY: No cough, no wheeze, no hemoptysis. No dyspnea.  CARDIOVASCULAR: No chest pain. No orthopnea. No palpitations. No syncope.  GASTROINTESTINAL: No nausea, no vomiting or diarrhea. Positive abdominal pain. No melena or hematochezia.  GENITOURINARY: No dysuria or hematuria.  ENDOCRINE: No polyuria or nocturia. No heat or cold intolerance.  HEMATOLOGY: No anemia. No bruising. No bleeding.  INTEGUMENTARY: No rashes. No lesions.  MUSCULOSKELETAL: No arthritis. No swelling. No gout. Positive flank pain NEUROLOGIC: No numbness, tingling, or ataxia. No seizure-type activity.  PSYCHIATRIC: No anxiety. No insomnia. No ADD.    Vitals:   Vitals:   07/15/16 1745 07/15/16 1926 07/16/16 0433 07/16/16 0720  BP: 118/75 92/60 90/61  104/67  Pulse: (!) 102 86 74 68  Resp: (!) 24 16 16 16   Temp: 98.8 F (37.1 C) 98.3 F (36.8 C) 97.8 F (36.6 C) 98.3 F (36.8 C)  TempSrc: Oral Axillary Axillary Oral  SpO2: 100% 98% 97% 98%  Weight:      Height:        Wt Readings from Last 3 Encounters:  07/15/16 105 lb (47.6 kg)  07/14/16 105 lb (47.6 kg)  06/11/16 113 lb (51.3 kg)     Intake/Output Summary (Last 24 hours) at  07/16/16 1222 Last data filed at 07/16/16 1112  Gross per 24 hour  Intake             1575 ml  Output              400 ml  Net             1175 ml    Physical Exam:   GENERAL: Pleasant-appearing in no apparent distress.  HEAD, EYES, EARS, NOSE AND THROAT: Atraumatic, normocephalic. Extraocular muscles are intact. Pupils equal and reactive to light. Sclerae anicteric. No conjunctival injection. No oro-pharyngeal erythema.  NECK: Supple. There  is no jugular venous distention. No bruits, no lymphadenopathy, no thyromegaly.  HEART: Regular rate and rhythm,. No murmurs, no rubs, no clicks.  LUNGS: Clear to auscultation bilaterally. No rales or rhonchi. No wheezes.  ABDOMEN: Soft, flat, nontender, Distended Has good bowel sounds. No hepatosplenomegaly appreciated.  EXTREMITIES: No evidence of any cyanosis, clubbing, or peripheral edema.  +2 pedal and radial pulses bilaterally.  NEUROLOGIC: The patient is alert, awake, and oriented x3 with no focal motor or sensory deficits appreciated bilaterally.  SKIN: Moist and warm with no rashes appreciated.  Psych: Not anxious, depressed LN: No inguinal LN enlargement    Antibiotics   Anti-infectives    Start     Dose/Rate Route Frequency Ordered Stop   07/15/16 1000  cefTRIAXone (ROCEPHIN) IVPB 1 g     1 g 100 mL/hr over 30 Minutes Intravenous Every 24 hours 07/14/16 1923     07/15/16 0000  cefTRIAXone (ROCEPHIN) 2 g in dextrose 5 % 50 mL IVPB  Status:  Discontinued     2 g 100 mL/hr over 30 Minutes Intravenous  Once 07/14/16 1355 07/14/16 1356   07/14/16 1915  cefTRIAXone (ROCEPHIN) 1 g in dextrose 5 % 50 mL IVPB  Status:  Discontinued     1 g 100 mL/hr over 30 Minutes Intravenous Every 24 hours 07/14/16 1904 07/14/16 1923   07/14/16 1230  cefTRIAXone (ROCEPHIN) IVPB 1 g     1 g 100 mL/hr over 30 Minutes Intravenous  Once 07/14/16 1217 07/14/16 1248   07/14/16 1215  cefTRIAXone (ROCEPHIN) 1 g in dextrose 5 % 50 mL IVPB  Status:  Discontinued      1 g 100 mL/hr over 30 Minutes Intravenous  Once 07/14/16 1208 07/14/16 1216      Medications   Scheduled Meds: . cefTRIAXone  1 g Intravenous Q24H  . docusate sodium  200 mg Oral BID  . enoxaparin (LOVENOX) injection  40 mg Subcutaneous Q24H  . furosemide  20 mg Intravenous BID  . levothyroxine  200 mcg Oral Daily  . nicotine  14 mg Transdermal Daily  . phenazopyridine  100 mg Oral TID WC  . polyethylene glycol  17 g Oral Daily  . senna  1 tablet Oral Daily  . zolpidem  5 mg Oral QHS   Continuous Infusions:  PRN Meds:.acetaminophen **OR** acetaminophen, albuterol, HYDROcodone-acetaminophen, ondansetron **OR** ondansetron (ZOFRAN) IV   Data Review:   Micro Results Recent Results (from the past 240 hour(s))  Rapid strep screen     Status: None   Collection Time: 07/14/16  8:46 AM  Result Value Ref Range Status   Streptococcus, Group A Screen (Direct) NEGATIVE NEGATIVE Final    Comment: (NOTE) A Rapid Antigen test may result negative if the antigen level in the sample is below the detection level of this test. The FDA has not cleared this test as a stand-alone test therefore the rapid antigen negative result has reflexed to a Group A Strep culture.   Rapid Influenza A&B Antigens ([facility] only)     Status: None   Collection Time: 07/14/16  8:46 AM  Result Value Ref Range Status   Influenza A ([facility]) NEGATIVE NEGATIVE Final   Influenza B ([facility]) NEGATIVE NEGATIVE Final  Culture, group A strep     Status: None   Collection Time: 07/14/16  8:46 AM  Result Value Ref Range Status   Specimen Description THROAT  Final   Special Requests NONE Reflexed from [id number]  Final   Culture   Final  NO GROUP A STREP (S.PYOGENES) ISOLATED Performed at [facility]    Report Status 07/16/2016 FINAL  Final  Blood culture (routine x 2)     Status: None (Preliminary result)   Collection Time: 07/14/16 11:13 AM  Result Value Ref Range Status   Specimen Description BLOOD LEFT AC   Final   Special Requests   Final    BOTTLES DRAWN AEROBIC AND ANAEROBIC AER ANA   Culture NO GROWTH 2 DAYS  Final   Report Status PENDING  Incomplete  Culture, blood (x 2)     Status: None (Preliminary result)   Collection Time: 07/14/16  7:19 PM  Result Value Ref Range Status   Specimen Description BLOOD RTHAND  Final   Special Requests BOTTLES DRAWN AEROBIC AND ANAEROBIC 8CC  Final   Culture NO GROWTH 2 DAYS  Final   Report Status PENDING  Incomplete    Radiology Reports Dg Chest Port 1 View  Result Date: 07/14/2016 CLINICAL DATA:  fever, chills, dysuria and lower abdominal pain and left-sided flank pain for the past 2 days. She has a long history of UTI and kidney infection. Urinalysis show UTI Hx congenital hypothyroidism, thyroid disease, smoker, birth control implant EXAM: PORTABLE CHEST - 1 VIEW COMPARISON:  10/25/2014 FINDINGS: Lungs are clear. Heart size and mediastinal contours are within normal limits. No effusion. Visualized bones unremarkable. IMPRESSION: No acute cardiopulmonary disease. Electronically Signed   By: Corlis Leak M.D.   On: 07/14/2016 19:50   Ct Renal Stone Study  Result Date: 07/15/2016 CLINICAL DATA:  25 year old female with urinary tract infection. EXAM: CT ABDOMEN AND PELVIS WITHOUT CONTRAST TECHNIQUE: Multidetector CT imaging of the abdomen and pelvis was performed following the standard protocol without IV contrast. COMPARISON:  Renal ultrasound dated 06/11/2016 and CT dated 05/24/2016 FINDINGS: Evaluation of this exam is limited in the absence of intravenous contrast. Lower chest: The visualized lung bases are clear. No intra-abdominal free air. Diffuse mesenteric edema and small free fluid within the pelvis. Hepatobiliary: The liver is grossly unremarkable. No intrahepatic biliary ductal dilatation. The gallbladder is contracted. There is thickened appearance of the gallbladder wall likely related to contraction and ascites. Pancreas: Unremarkable. No  pancreatic ductal dilatation or surrounding inflammatory changes. Spleen: Normal in size without focal abnormality. Adrenals/Urinary Tract: The adrenal glands are grossly unremarkable. The kidneys are enlarged and edematous. There is mild fullness of the renal collecting systems. There is no hydronephrosis or nephrolithiasis. Bilateral perinephric stranding noted. There is apparent circumferential thickening of the ureters with periureteric stranding. There is mild diffuse thickened appearance of the bladder wall. Stomach/Bowel: The stomach is distended with ingested contents. No evidence of gastric outlet or small bowel obstruction. Large amount of stool noted throughout the colon. Normal appendix. Vascular/Lymphatic: The abdominal aorta and IVC are grossly unremarkable on this noncontrast study. No portal venous gas identified. Multiple mildly enlarged retroperitoneal lymph nodes, likely reactive. Reproductive: The uterus is retroverted and grossly unremarkable. The ovaries are grossly unremarkable as well. Other: Mild diffuse subcutaneous edema.  No fluid collection. Musculoskeletal: No acute or significant osseous findings. IMPRESSION: Findings most compatible with UTI and pyelonephritis. Correlation with urinalysis recommended. No renal stones identified. No drainable fluid collection or abscess noted. Mild diffuse mesenteric edema, small ascites, and anasarca. Constipation.  No bowel obstruction.  Normal appendix. Contracted gallbladder. Electronically Signed   By: Elgie Collard M.D.   On: 07/15/2016 23:14     CBC  Recent Labs Lab 07/14/16 1919 07/15/16 0526 07/16/16 1610  WBC 17.5* 22.5* 18.2*  HGB 11.8* 12.0 11.0*  HCT 35.4 34.9* 32.4*  PLT 260 286 293  MCV 98.6 97.6 97.9  MCH 33.0 33.6 33.2  MCHC 33.4 34.5 33.9  RDW 13.3 13.0 13.4  LYMPHSABS 1.1  --   --   MONOABS 0.9  --   --   EOSABS 0.1  --   --   BASOSABS 0.0  --   --     Chemistries   Recent Labs Lab 07/14/16 1919  07/15/16 0526 07/16/16 0610  NA 136 139 136  K 3.4* 4.7 3.9  CL 101 107 106  CO2 29 27 25   GLUCOSE 97 105* 113*  BUN 16 17 10   CREATININE 0.74 0.64 0.58  CALCIUM 8.0* 7.9* 7.8*  MG 1.8  --   --   AST 30  --   --   ALT 22  --   --   ALKPHOS 69  --   --   BILITOT 0.3  --   --    ------------------------------------------------------------------------------------------------------------------ estimated creatinine clearance is 77.9 mL/min (by C-G formula based on SCr of 0.58 mg/dL). ------------------------------------------------------------------------------------------------------------------ No results for input(s): HGBA1C in the last 72 hours. ------------------------------------------------------------------------------------------------------------------ No results for input(s): CHOL, HDL, LDLCALC, TRIG, CHOLHDL, LDLDIRECT in the last 72 hours. ------------------------------------------------------------------------------------------------------------------ No results for input(s): TSH, T4TOTAL, T3FREE, THYROIDAB in the last 72 hours.  Invalid input(s): FREET3 ------------------------------------------------------------------------------------------------------------------ No results for input(s): VITAMINB12, FOLATE, FERRITIN, TIBC, IRON, RETICCTPCT in the last 72 hours.  Coagulation profile  Recent Labs Lab 07/14/16 1919  INR 1.17    No results for input(s): DDIMER in the last 72 hours.  Cardiac Enzymes No results for input(s): CKMB, TROPONINI, MYOGLOBIN in the last 168 hours.  Invalid input(s): CK ------------------------------------------------------------------------------------------------------------------ Invalid input(s): POCBNP    Assessment & Plan  Patient is a 25 year old admitted with acute pyelonephritis 1. Sepsis due to pyelonephritis.Continue antibiotics CT per renal protocol shows no evidence of renal stones  2. Anasarca albumin not  significantly low. We'll give IV Lasix likely related to fluid overload   3. Severe constipation will start on Colace senna and MiraLAX  3.Tobacco abuse. Recommended to stop    Code Status Orders        Start     Ordered   07/14/16 1905  Full code  Continuous     07/14/16 1904    Code Status History    Date Active Date Inactive Code Status Order ID Comments User Context   This patient has a current code status but no historical code status.           Consults  ID  DVT Prophylaxis  Lovenox  Lab Results  Component Value Date   PLT 293 07/16/2016     Time Spent in minutes 32minutes Greater than 50% of time spent in care coordination and counseling patient regarding the condition and plan of care.   Auburn BilberryPATEL, Bridgid Printz M.D on 07/16/2016 at 12:22 PM  Between 7am to 6pm - Pager - [phone]  After 6pm go to [email or web address] - password [email or web address] [facility]  [facility]   Office  [phone]

## 2016-07-16 NOTE — Progress Notes (Signed)
Urine collected and sent to lab.

## 2016-07-16 NOTE — Plan of Care (Signed)
Problem: Education: Goal: Knowledge of [facility] Education information/materials will improve Outcome: Progressing Patient is imptroving   Problem: Safety: Goal: Ability to remain free from injury will improve Outcome: Progressing Patient has had no injury thus far.   Problem: Bowel/Gastric: Goal: Will not experience complications related to bowel motility Outcome: Not Progressing Patient was given multiple bowel stimulants- still no stool passage.

## 2016-07-16 NOTE — Progress Notes (Signed)
Patient is alert and oriented, vss, having flank pain which is relieved with vicodin.  Started on stool softeners and laxatives today.

## 2016-07-16 NOTE — Progress Notes (Signed)
Pharmacy Antibiotic Note  Martha Proctor is a 25 y.o. female admitted on 07/14/2016 with UTI.  Pharmacy has been consulted for Ceftriaxone dosing. Hx: recurrent UTIs  Plan: Will continue with Ceftriaxone 1 gm IV q24h.  Recent Urine cxs: Enterococcus+ 06/11/16  and E.coli + 05/24/16, 05/19/16 and 10/6 17.  E.coli has been Sensitive to CTX in these past cultures.    Height: 5' (152.4 cm) (Simultaneous filing. User may not have seen previous data.) Weight: 105 lb (47.6 kg) (Simultaneous filing. User may not have seen previous data.) IBW/kg (Calculated) : 45.5  Temp (24hrs), Avg:98.9 F (37.2 C), Min:97.8 F (36.6 C), Max:101.5 F (38.6 C)   Recent Labs Lab 07/14/16 1113 07/14/16 1919 07/14/16 1929 07/14/16 2210 07/15/16 0526 07/16/16 0610  WBC  --  17.5*  --   --  22.5* 18.2*  CREATININE  --  0.74  --   --  0.64 0.58  LATICACIDVEN 1.1  --  1.4 1.2  --   --     Estimated Creatinine Clearance: 77.9 mL/min (by C-G formula based on SCr of 0.58 mg/dL).    No Known Allergies  Antimicrobials this admission: CTX 12/18 >>       >>    Dose adjustments this admission:    Microbiology results: 12/18 BCx: Pending 12/20 UCx: Pending    Sputum:      MRSA PCR:    Thank you for allowing pharmacy to be a part of this patient's care.  Joedy Eickhoff C 07/16/2016 3:19 PM

## 2016-07-17 LAB — BASIC METABOLIC PANEL
Anion gap: 8 (ref 5–15)
BUN: 15 mg/dL (ref 6–20)
CALCIUM: 7.9 mg/dL — AB (ref 8.9–10.3)
CHLORIDE: 102 mmol/L (ref 101–111)
CO2: 26 mmol/L (ref 22–32)
CREATININE: 0.74 mg/dL (ref 0.44–1.00)
Glucose, Bld: 98 mg/dL (ref 65–99)
Potassium: 3.8 mmol/L (ref 3.5–5.1)
SODIUM: 136 mmol/L (ref 135–145)

## 2016-07-17 LAB — CBC
HCT: 34.5 % — ABNORMAL LOW (ref 35.0–47.0)
Hemoglobin: 11.9 g/dL — ABNORMAL LOW (ref 12.0–16.0)
MCH: 33.6 pg (ref 26.0–34.0)
MCHC: 34.6 g/dL (ref 32.0–36.0)
MCV: 97.1 fL (ref 80.0–100.0)
PLATELETS: 403 10*3/uL (ref 150–440)
RBC: 3.56 MIL/uL — AB (ref 3.80–5.20)
RDW: 13.1 % (ref 11.5–14.5)
WBC: 14.2 10*3/uL — AB (ref 3.6–11.0)

## 2016-07-17 LAB — URINE CULTURE: Culture: NO GROWTH

## 2016-07-17 MED ORDER — CIPROFLOXACIN HCL 500 MG PO TABS
500.0000 mg | ORAL_TABLET | Freq: Two times a day (BID) | ORAL | Status: DC
  Administered 2016-07-17 – 2016-07-18 (×2): 500 mg via ORAL
  Filled 2016-07-17 (×2): qty 1

## 2016-07-17 NOTE — Progress Notes (Addendum)
[employer] - [place] at [facility]                                                                                                                                                                                  Patient Demographics   Martha Proctor, is a 25 y.o. female, DOB - 19-Apr-1991, ZOX:[id number]  Admit date - 07/14/2016   Admitting Physician Shaune Pollack, MD  Outpatient Primary MD for the patient is [name], MD   LOS - 3  Subjective:  Mild fever and left flank pain.  Review of Systems:   CONSTITUTIONAL: has fever. No fatigue, weakness. No weight gain, no weight loss.  EYES: No blurry or double vision.  ENT: No tinnitus. No postnasal drip. No redness of the oropharynx.  RESPIRATORY: No cough, no wheeze, no hemoptysis. No dyspnea.  CARDIOVASCULAR: No chest pain. No orthopnea. No palpitations. No syncope.  GASTROINTESTINAL: No nausea, no vomiting or diarrhea. Positive abdominal pain. No melena or hematochezia.  GENITOURINARY: No dysuria or hematuria.  flank pain. ENDOCRINE: No polyuria or nocturia. No heat or cold intolerance.  HEMATOLOGY: No anemia. No bruising. No bleeding.  INTEGUMENTARY: No rashes. No lesions.  MUSCULOSKELETAL: No arthritis. No swelling. No gout. Positive flank pain NEUROLOGIC: No numbness, tingling, or ataxia. No seizure-type activity.  PSYCHIATRIC: No anxiety. No insomnia. No ADD.    Vitals:   Vitals:   07/16/16 0720 07/16/16 2024 07/16/16 2025 07/17/16 0542  BP: 104/67 (!) 82/48 97/61 123/72  Pulse: 68 79 76 80  Resp: 16 19  18   Temp: 98.3 F (36.8 C) 98.3 F (36.8 C)  99.1 F (37.3 C)  TempSrc: Oral Oral  Oral  SpO2: 98% 96%  98%  Weight:      Height:        Wt Readings from Last 3 Encounters:  07/15/16 105 lb (47.6 kg)  07/14/16 105 lb (47.6 kg)  06/11/16 113 lb (51.3 kg)     Intake/Output Summary (Last 24 hours) at 07/17/16 1622 Last data filed at 07/17/16 1300  Gross per 24 hour  Intake               960 ml  Output                0 ml  Net              960 ml    Physical Exam:   GENERAL: Pleasant-appearing in no apparent distress.  HEAD, EYES, EARS, NOSE AND THROAT: Atraumatic, normocephalic. Extraocular muscles are intact. Pupils equal and reactive to light. Sclerae anicteric. No conjunctival injection. No oro-pharyngeal erythema.  NECK: Supple. There is no jugular venous distention. No  bruits, no lymphadenopathy, no thyromegaly.  HEART: Regular rate and rhythm,. No murmurs, no rubs, no clicks.  LUNGS: Clear to auscultation bilaterally. No rales or rhonchi. No wheezes.  ABDOMEN: Soft, flat, nontender, Distended Has good bowel sounds. No hepatosplenomegaly appreciated. Left CVA tenderness. EXTREMITIES: No evidence of any cyanosis, clubbing, or peripheral edema.  +2 pedal and radial pulses bilaterally.  NEUROLOGIC: The patient is alert, awake, and oriented x3 with no focal motor or sensory deficits appreciated bilaterally.  SKIN: Moist and warm with no rashes appreciated.  Psych: Not anxious, depressed LN: No inguinal LN enlargement    Antibiotics   Anti-infectives    Start     Dose/Rate Route Frequency Ordered Stop   07/17/16 2000  ciprofloxacin (CIPRO) tablet 500 mg     500 mg Oral 2 times daily 07/17/16 1528     07/15/16 1000  cefTRIAXone (ROCEPHIN) IVPB 1 g  Status:  Discontinued     1 g 100 mL/hr over 30 Minutes Intravenous Every 24 hours 07/14/16 1923 07/17/16 1528   07/15/16 0000  cefTRIAXone (ROCEPHIN) 2 g in dextrose 5 % 50 mL IVPB  Status:  Discontinued     2 g 100 mL/hr over 30 Minutes Intravenous  Once 07/14/16 1355 07/14/16 1356   07/14/16 1915  cefTRIAXone (ROCEPHIN) 1 g in dextrose 5 % 50 mL IVPB  Status:  Discontinued     1 g 100 mL/hr over 30 Minutes Intravenous Every 24 hours 07/14/16 1904 07/14/16 1923   07/14/16 1230  cefTRIAXone (ROCEPHIN) IVPB 1 g     1 g 100 mL/hr over 30 Minutes Intravenous  Once 07/14/16 1217 07/14/16 1248   07/14/16 1215  cefTRIAXone  (ROCEPHIN) 1 g in dextrose 5 % 50 mL IVPB  Status:  Discontinued     1 g 100 mL/hr over 30 Minutes Intravenous  Once 07/14/16 1208 07/14/16 1216      Medications   Scheduled Meds: . ciprofloxacin  500 mg Oral BID  . docusate sodium  200 mg Oral BID  . enoxaparin (LOVENOX) injection  40 mg Subcutaneous Q24H  . furosemide  20 mg Intravenous BID  . levothyroxine  200 mcg Oral Daily  . nicotine  14 mg Transdermal Daily  . polyethylene glycol  17 g Oral Daily  . senna  1 tablet Oral Daily  . zolpidem  5 mg Oral QHS   Continuous Infusions:  PRN Meds:.acetaminophen **OR** acetaminophen, albuterol, HYDROcodone-acetaminophen, ondansetron **OR** ondansetron (ZOFRAN) IV   Data Review:   Micro Results Recent Results (from the past 240 hour(s))  Rapid strep screen     Status: None   Collection Time: 07/14/16  8:46 AM  Result Value Ref Range Status   Streptococcus, Group A Screen (Direct) NEGATIVE NEGATIVE Final    Comment: (NOTE) A Rapid Antigen test may result negative if the antigen level in the sample is below the detection level of this test. The FDA has not cleared this test as a stand-alone test therefore the rapid antigen negative result has reflexed to a Group A Strep culture.   Rapid Influenza A&B Antigens ([facility] only)     Status: None   Collection Time: 07/14/16  8:46 AM  Result Value Ref Range Status   Influenza A ([facility]) NEGATIVE NEGATIVE Final   Influenza B ([facility]) NEGATIVE NEGATIVE Final  Culture, group A strep     Status: None   Collection Time: 07/14/16  8:46 AM  Result Value Ref Range Status   Specimen Description THROAT  Final  Special Requests NONE Reflexed from [id number]  Final   Culture   Final    NO GROUP A STREP (S.PYOGENES) ISOLATED Performed at [facility]    Report Status 07/16/2016 FINAL  Final  Blood culture (routine x 2)     Status: None (Preliminary result)   Collection Time: 07/14/16 11:13 AM  Result Value Ref Range Status   Specimen  Description BLOOD LEFT AC  Final   Special Requests   Final    BOTTLES DRAWN AEROBIC AND ANAEROBIC AER ANA   Culture NO GROWTH 3 DAYS  Final   Report Status PENDING  Incomplete  Culture, blood (x 2)     Status: None (Preliminary result)   Collection Time: 07/14/16  7:19 PM  Result Value Ref Range Status   Specimen Description BLOOD RTHAND  Final   Special Requests BOTTLES DRAWN AEROBIC AND ANAEROBIC 8CC  Final   Culture NO GROWTH 3 DAYS  Final   Report Status PENDING  Incomplete  Urine culture     Status: None   Collection Time: 07/16/16  4:42 AM  Result Value Ref Range Status   Specimen Description URINE, RANDOM  Final   Special Requests NONE  Final   Culture NO GROWTH Performed at [facility]   Final   Report Status 07/17/2016 FINAL  Final    Radiology Reports Dg Chest Port 1 View  Result Date: 07/14/2016 CLINICAL DATA:  fever, chills, dysuria and lower abdominal pain and left-sided flank pain for the past 2 days. She has a long history of UTI and kidney infection. Urinalysis show UTI Hx congenital hypothyroidism, thyroid disease, smoker, birth control implant EXAM: PORTABLE CHEST - 1 VIEW COMPARISON:  10/25/2014 FINDINGS: Lungs are clear. Heart size and mediastinal contours are within normal limits. No effusion. Visualized bones unremarkable. IMPRESSION: No acute cardiopulmonary disease. Electronically Signed   By: Corlis Leak M.D.   On: 07/14/2016 19:50   Ct Renal Stone Study  Result Date: 07/15/2016 CLINICAL DATA:  25 year old female with urinary tract infection. EXAM: CT ABDOMEN AND PELVIS WITHOUT CONTRAST TECHNIQUE: Multidetector CT imaging of the abdomen and pelvis was performed following the standard protocol without IV contrast. COMPARISON:  Renal ultrasound dated 06/11/2016 and CT dated 05/24/2016 FINDINGS: Evaluation of this exam is limited in the absence of intravenous contrast. Lower chest: The visualized lung bases are clear. No intra-abdominal free air.  Diffuse mesenteric edema and small free fluid within the pelvis. Hepatobiliary: The liver is grossly unremarkable. No intrahepatic biliary ductal dilatation. The gallbladder is contracted. There is thickened appearance of the gallbladder wall likely related to contraction and ascites. Pancreas: Unremarkable. No pancreatic ductal dilatation or surrounding inflammatory changes. Spleen: Normal in size without focal abnormality. Adrenals/Urinary Tract: The adrenal glands are grossly unremarkable. The kidneys are enlarged and edematous. There is mild fullness of the renal collecting systems. There is no hydronephrosis or nephrolithiasis. Bilateral perinephric stranding noted. There is apparent circumferential thickening of the ureters with periureteric stranding. There is mild diffuse thickened appearance of the bladder wall. Stomach/Bowel: The stomach is distended with ingested contents. No evidence of gastric outlet or small bowel obstruction. Large amount of stool noted throughout the colon. Normal appendix. Vascular/Lymphatic: The abdominal aorta and IVC are grossly unremarkable on this noncontrast study. No portal venous gas identified. Multiple mildly enlarged retroperitoneal lymph nodes, likely reactive. Reproductive: The uterus is retroverted and grossly unremarkable. The ovaries are grossly unremarkable as well. Other: Mild diffuse subcutaneous edema.  No fluid collection. Musculoskeletal:  No acute or significant osseous findings. IMPRESSION: Findings most compatible with UTI and pyelonephritis. Correlation with urinalysis recommended. No renal stones identified. No drainable fluid collection or abscess noted. Mild diffuse mesenteric edema, small ascites, and anasarca. Constipation.  No bowel obstruction.  Normal appendix. Contracted gallbladder. Electronically Signed   By: Elgie CollardArash  Radparvar M.D.   On: 07/15/2016 23:14     CBC  Recent Labs Lab 07/14/16 1919 07/15/16 0526 07/16/16 0610 07/17/16 0526   WBC 17.5* 22.5* 18.2* 14.2*  HGB 11.8* 12.0 11.0* 11.9*  HCT 35.4 34.9* 32.4* 34.5*  PLT 260 286 293 403  MCV 98.6 97.6 97.9 97.1  MCH 33.0 33.6 33.2 33.6  MCHC 33.4 34.5 33.9 34.6  RDW 13.3 13.0 13.4 13.1  LYMPHSABS 1.1  --   --   --   MONOABS 0.9  --   --   --   EOSABS 0.1  --   --   --   BASOSABS 0.0  --   --   --     Chemistries   Recent Labs Lab 07/14/16 1919 07/15/16 0526 07/16/16 0610 07/17/16 0526  NA 136 139 136 136  K 3.4* 4.7 3.9 3.8  CL 101 107 106 102  CO2 29 27 25 26   GLUCOSE 97 105* 113* 98  BUN 16 17 10 15   CREATININE 0.74 0.64 0.58 0.74  CALCIUM 8.0* 7.9* 7.8* 7.9*  MG 1.8  --   --   --   AST 30  --   --   --   ALT 22  --   --   --   ALKPHOS 69  --   --   --   BILITOT 0.3  --   --   --    ------------------------------------------------------------------------------------------------------------------ estimated creatinine clearance is 77.9 mL/min (by C-G formula based on SCr of 0.74 mg/dL). ------------------------------------------------------------------------------------------------------------------ No results for input(s): HGBA1C in the last 72 hours. ------------------------------------------------------------------------------------------------------------------ No results for input(s): CHOL, HDL, LDLCALC, TRIG, CHOLHDL, LDLDIRECT in the last 72 hours. ------------------------------------------------------------------------------------------------------------------ No results for input(s): TSH, T4TOTAL, T3FREE, THYROIDAB in the last 72 hours.  Invalid input(s): FREET3 ------------------------------------------------------------------------------------------------------------------ No results for input(s): VITAMINB12, FOLATE, FERRITIN, TIBC, IRON, RETICCTPCT in the last 72 hours.  Coagulation profile  Recent Labs Lab 07/14/16 1919  INR 1.17    No results for input(s): DDIMER in the last 72 hours.  Cardiac Enzymes No results for  input(s): CKMB, TROPONINI, MYOGLOBIN in the last 168 hours.  Invalid input(s): CK ------------------------------------------------------------------------------------------------------------------ Invalid input(s): POCBNP    Assessment & Plan  Patient is a 25 year old admitted with acute pyelonephritis 1. Sepsis due to pyelonephritis. She is on rocephin iv, change to po cipro. U/C: no growth so far. CT per renal protocol shows no evidence of renal stones. Leukocytosis is improving. Per Dr. Sampson GoonFitzgerald, Can dc when results available on a 14 day treatment course  Once done with treatment would rec a prolonged suppressive course with macrobid 100 qd. She should see urology as well.  I suggested increase fluids, take cranberry pills and Vit C 1000 mg a day.  2. Anasarca albumin not significantly low. given IV Lasix likely related to fluid overload   3. Severe constipation, on Colace senna and MiraLAX  3.Tobacco abuse. Recommended to stop    Code Status Orders        Start     Ordered   07/14/16 1905  Full code  Continuous     07/14/16 1904    Code Status History    Date  Active Date Inactive Code Status Order ID Comments User Context   This patient has a current code status but no historical code status.           Consults  ID  DVT Prophylaxis  Lovenox  Lab Results  Component Value Date   PLT 403 07/17/2016     Time Spent in minutes 37 minutes Greater than 50% of time spent in care coordination and counseling patient regarding the condition and plan of care. Discussed with the patient, her mother, nurse and case Production designer, theatre/television/film. Possible discharge to home tomorrow.  Shaune Pollack M.D on 07/17/2016 at 4:22 PM  Between 7am to 6pm - Pager - [phone]  After 6pm go to [email or web address] - password [email or web address] [facility]  [facility]   Office  [phone]

## 2016-07-17 NOTE — Progress Notes (Signed)
Shift assessment completed at 0730. Pt was awakened briefly at 0730 and took her pyridium, lasix iv. Dr.Chen in to round on pt at 0930, pt declared to Dr .Imogene Burnchen that she was going to go home today whether he discharged her or not. Dr. Imogene Burnhen calmly explained to pt that he is waiting for urine culture to Rx correct antibiotic for pt to take at home, and explained that pt's wbc better at 14 but would like pt to stay one more day. Pt dialed her mother's number on the phone while Dr. Was explaining this and began to eat her breakfast, declaring that "she just doesn't want to talk to anybody right now",. And pt asked Dr. To explain this to her mother. Dr. Imogene Burnhen got on phone with pt's mother, who then talked to pt and convinced her to stay. Pt stated that she wanted to see her children, then stated that the children are with their father and that they will stay there as long as necessary until she feels better. Pt accepted iv antibiotic and pain medication with morning meds.

## 2016-07-17 NOTE — Progress Notes (Signed)
ID E note Culture from urine is negative- was done after admission and receiving ceftriaxone.    Martha Proctor is a 25 y.o. female with recurrent UTIs and currently with pyelonephritis with elevated WBC and CT showing pyelo.  She has not seen urology. May have vesicoureteral reflux.   Recommendations Continue ceftraixone but if afebrile tomorrow and culture remains negative can dc on empiric ciprofloxacin 500 bid for 14 days  Once done with treatment course  would rec a prolonged suppressive course with macrobid 100 qd.- please give her an rx at dc for this with 6 refills  She should see urology as well - she states cannot make it to [place] due to transport.  ? [facility]    I suggested increase fluids, take cranberry pills and Vit C 1000 mg a day  I can see in 3-4 weeks in followup

## 2016-07-17 NOTE — Care Management (Signed)
Case discussed with attending. Awaiting culture but it is anticipated that patient will discharge on oral antibiotics. Following.

## 2016-07-18 LAB — CBC
HEMATOCRIT: 35.8 % (ref 35.0–47.0)
Hemoglobin: 12.2 g/dL (ref 12.0–16.0)
MCH: 33.3 pg (ref 26.0–34.0)
MCHC: 34 g/dL (ref 32.0–36.0)
MCV: 97.7 fL (ref 80.0–100.0)
Platelets: 447 10*3/uL — ABNORMAL HIGH (ref 150–440)
RBC: 3.66 MIL/uL — AB (ref 3.80–5.20)
RDW: 13.3 % (ref 11.5–14.5)
WBC: 9.7 10*3/uL (ref 3.6–11.0)

## 2016-07-18 MED ORDER — CIPROFLOXACIN HCL 500 MG PO TABS: 500.0000 mg | ORAL_TABLET | Freq: Two times a day (BID) | ORAL | 0 refills | Status: DC

## 2016-07-18 MED ORDER — NITROFURANTOIN MONOHYD MACRO 100 MG PO CAPS: 100.0000 mg | ORAL_CAPSULE | Freq: Two times a day (BID) | ORAL | 6 refills | Status: DC

## 2016-07-18 MED ORDER — ASCORBIC ACID 1000 MG PO TABS: 1000.0000 mg | ORAL_TABLET | Freq: Every day | ORAL | 2 refills | Status: DC

## 2016-07-18 MED ORDER — NITROFURANTOIN MONOHYD MACRO 100 MG PO CAPS: 100.0000 mg | ORAL_CAPSULE | Freq: Every day | ORAL | 6 refills | Status: DC

## 2016-07-18 MED ORDER — VITAMIN C 500 MG PO TABS: 1000.0000 mg | ORAL_TABLET | Freq: Every day | ORAL | Status: DC

## 2016-07-18 NOTE — Plan of Care (Signed)
Problem: Bowel/Gastric: Goal: Will not experience complications related to bowel motility Outcome: Completed/Met Date Met: 07/18/16 Pt has met all goals for discharge.
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
## 2016-07-18 NOTE — Care Management (Signed)
No IV antibiotics needed at that timeBarbara Cower- [facility] with [facility]  No further RNCM needs.

## 2016-07-18 NOTE — Progress Notes (Signed)
Shift assessment completed at 0800. Pt stated she still has l flank pain, but is able to urinate without difficulty. Friend at bedside asleep in recliner. Pt assessment is negative except for pain, pt is motivated for d/c today, stated transportation will not be available until after 1600. Since assessment, nicotine patch has been removed from pt's r upper arm, and new one applied to pt's L upper arm. Call bell in reach.

## 2016-07-18 NOTE — Discharge Instructions (Signed)
ciprofloxacin 500 bid for 14 days  Once done with treatment course  would rec a prolonged suppressive course with macrobid 100 qd. see [facility] as otpt, increase fluids, take cranberry pills and Vit C 1000 mg a day.

## 2016-07-18 NOTE — Progress Notes (Signed)
Pt called this writer at approx 1150 to report that she had left a blood clot in the toilet with urination. This writer checked, found clot approx size of a nickel in the toilet with clear yellow urine. Pt reported spastic l flank pain after urinating, this Clinical research associatewriter notified Dr. Imogene Burnhen of same, pt received vicodin po. Pt dc'd at this time ambulatory to front entrance of hospital. This writer removed PIV from l hand with catheter intact. Pt received scripts and d/c instructions, signed for receipt of these.

## 2016-07-19 LAB — CULTURE, BLOOD (ROUTINE X 2)
CULTURE: NO GROWTH
Culture: NO GROWTH

## 2016-09-13 ENCOUNTER — Ambulatory Visit
Admission: EM | Admit: 2016-09-13 | Discharge: 2016-09-13 | Disposition: A | Discharge status: Home / Self Care | Payer: BLUE CROSS/BLUE SHIELD | Attending: Family Medicine | Admitting: Family Medicine

## 2016-09-13 ENCOUNTER — Encounter: Payer: Self-pay | Admitting: Gynecology

## 2016-09-13 DIAGNOSIS — K047 Periapical abscess without sinus: Secondary | ICD-10-CM | POA: Diagnosis not present

## 2016-09-13 MED ORDER — AMOXICILLIN-POT CLAVULANATE 875-125 MG PO TABS: 1.0000 | ORAL_TABLET | Freq: Two times a day (BID) | ORAL | 0 refills | Status: DC

## 2016-09-13 MED ORDER — HYDROCODONE-ACETAMINOPHEN 5-325 MG PO TABS: ORAL_TABLET | ORAL | 0 refills | Status: DC
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
## 2016-09-13 NOTE — ED Triage Notes (Signed)
Per patient right wisdom tooth abscess x over a month. [facility] and given Rx. Clindamycin and Narco. Per patient still with abscess  tooth and painful.

## 2016-09-30 ENCOUNTER — Other Ambulatory Visit: Payer: Self-pay | Admitting: Family Medicine

## 2016-09-30 NOTE — Telephone Encounter (Signed)
She is very non-compliant.   Can we get her in for a follow-up appointment, but please ask her to take her thyroid medicine for at least a week every single day, so we can have some idea where her levels are right now?  Ok to refill #90, 0 ref

## 2016-09-30 NOTE — Telephone Encounter (Signed)
Left message for Phylicia to return my call.

## 2016-09-30 NOTE — Telephone Encounter (Signed)
Last office visit 05/29/2016 with Harlin Heys. Gessner for flank pain.  Last TSH checked on 05/02/2016 with came back at 98.02 and D. Leone PayorGessner referred her to endocrinology which she cancelled.  Refill?

## 2016-10-01 NOTE — Telephone Encounter (Signed)
Martha Proctor notified as instructed by telephone.  Appointment moved up to Monday 10/06/16 at 9:45 am with Dr. Patsy Lageropland.

## 2016-10-01 NOTE — Telephone Encounter (Signed)
She has missed so many appointments that I want to see her face to face.

## 2016-10-01 NOTE — Telephone Encounter (Signed)
Martha Proctor scheduled for follow up with Dr. Patsy Lageropland 10/08/2016 at 9:30 am to follow up on her thyroid.  She states she has thyroid medication but has not been taking it consistently.  Advised she needs to make sure she is taking it every single day up to her appointment with Dr. Patsy Lageropland so we can recheck her thyroid levels.  She also wanted be to let Dr. Patsy Lageropland know she is having major depression.  She became teary on the phone.  She is not currently taking anything for depression.  She states she does not suicidal and does not have thoughts of hurting herself when asked.  She is asking if Dr. Patsy Lageropland will prescribe her something to get her to her appointment next week with him.

## 2016-10-06 ENCOUNTER — Encounter: Payer: Self-pay | Admitting: Family Medicine

## 2016-10-06 ENCOUNTER — Ambulatory Visit (INDEPENDENT_AMBULATORY_CARE_PROVIDER_SITE_OTHER): Payer: BLUE CROSS/BLUE SHIELD | Admitting: Family Medicine

## 2016-10-06 VITALS — BP 102/59 | HR 69 | Temp 98.5°F | Ht 60.5 in | Wt 103.8 lb

## 2016-10-06 DIAGNOSIS — F411 Generalized anxiety disorder: Secondary | ICD-10-CM

## 2016-10-06 DIAGNOSIS — F339 Major depressive disorder, recurrent, unspecified: Secondary | ICD-10-CM

## 2016-10-06 DIAGNOSIS — E031 Congenital hypothyroidism without goiter: Secondary | ICD-10-CM

## 2016-10-06 DIAGNOSIS — F410 Panic disorder [episodic paroxysmal anxiety] without agoraphobia: Secondary | ICD-10-CM

## 2016-10-06 MED ORDER — BUSPIRONE HCL 10 MG PO TABS: ORAL_TABLET | ORAL | 2 refills | Status: DC

## 2016-10-06 MED ORDER — LEVOTHYROXINE SODIUM 200 MCG PO TABS: ORAL_TABLET | ORAL | 1 refills | Status: AC

## 2016-10-06 MED ORDER — FLUOXETINE HCL 20 MG PO TABS: 20.0000 mg | ORAL_TABLET | Freq: Every day | ORAL | 2 refills | Status: DC
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
## 2016-10-06 NOTE — Patient Instructions (Signed)

## 2016-10-06 NOTE — Progress Notes (Signed)
Pre visit review using our clinic review tool, if applicable. No additional management support is needed unless otherwise documented below in the visit note. 

## 2016-10-08 ENCOUNTER — Ambulatory Visit: Payer: Self-pay | Admitting: Family Medicine

## 2016-10-22 ENCOUNTER — Encounter: Payer: Self-pay | Admitting: Family Medicine

## 2016-10-22 ENCOUNTER — Ambulatory Visit (INDEPENDENT_AMBULATORY_CARE_PROVIDER_SITE_OTHER): Payer: BLUE CROSS/BLUE SHIELD | Admitting: Family Medicine

## 2016-10-22 ENCOUNTER — Telehealth: Payer: Self-pay | Admitting: Family Medicine

## 2016-10-22 VITALS — BP 106/61 | HR 83 | Temp 98.3°F | Ht 60.5 in | Wt 107.8 lb

## 2016-10-22 DIAGNOSIS — G90522 Complex regional pain syndrome I of left lower limb: Secondary | ICD-10-CM | POA: Diagnosis not present

## 2016-10-22 DIAGNOSIS — E031 Congenital hypothyroidism without goiter: Secondary | ICD-10-CM

## 2016-10-22 DIAGNOSIS — F339 Major depressive disorder, recurrent, unspecified: Secondary | ICD-10-CM | POA: Diagnosis not present

## 2016-10-22 DIAGNOSIS — K047 Periapical abscess without sinus: Secondary | ICD-10-CM

## 2016-10-22 LAB — T3, FREE: T3, Free: 2.6 pg/mL (ref 2.3–4.2)

## 2016-10-22 LAB — T4, FREE: FREE T4: 0.45 ng/dL — AB (ref 0.60–1.60)

## 2016-10-22 LAB — TSH: TSH: 38.77 u[IU]/mL — ABNORMAL HIGH (ref 0.35–4.50)

## 2016-10-22 MED ORDER — CLINDAMYCIN HCL 300 MG PO CAPS: 300.0000 mg | ORAL_CAPSULE | Freq: Three times a day (TID) | ORAL | 0 refills | Status: DC

## 2016-10-22 MED ORDER — AMITRIPTYLINE HCL 25 MG PO TABS: 25.0000 mg | ORAL_TABLET | Freq: Every day | ORAL | 2 refills | Status: DC

## 2016-10-22 MED ORDER — HYDROCODONE-ACETAMINOPHEN 5-325 MG PO TABS: 1.0000 | ORAL_TABLET | Freq: Four times a day (QID) | ORAL | 0 refills | Status: DC | PRN
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
## 2016-10-22 NOTE — Progress Notes (Signed)
Pre visit review using our clinic review tool, if applicable. No additional management support is needed unless otherwise documented below in the visit note. 

## 2016-10-22 NOTE — Patient Instructions (Signed)

## 2016-10-23 ENCOUNTER — Telehealth: Payer: Self-pay | Admitting: Family Medicine

## 2016-10-23 MED ORDER — OXYCODONE-ACETAMINOPHEN 5-325 MG PO TABS: 1.0000 | ORAL_TABLET | Freq: Four times a day (QID) | ORAL | 0 refills | Status: DC | PRN

## 2016-10-23 NOTE — Telephone Encounter (Signed)
Printed and in donna's box  Please call. I will need her full pull bottle of vicodin for disposal prior to getting a new script.
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
## 2016-10-23 NOTE — Telephone Encounter (Signed)
Patient called and is asking to be called back as soon as possible. In case, she needs to come in to pick up rx before closing.  Patient can be reached at [phone].

## 2016-10-23 NOTE — Telephone Encounter (Signed)
Spoke to patient she is aware that she needs to bring bottle of vicodin back before she can get a new script

## 2016-10-23 NOTE — Telephone Encounter (Signed)
Spoke to pt again.  She is aware that she cannot get a new rx until she brings in the bottle of vicodin  Per dr copland

## 2016-10-23 NOTE — Telephone Encounter (Signed)
Patient Name: Martha DawleyCIARA Proctor  DOB: May 19, 1991    Initial Comment caller states she is having a reaction to medication   Nurse Assessment  Nurse: Stefano GaulStringer, RN, Dwana CurdVera Date/Time Lamount Cohen(Eastern Time): 10/23/2016 11:22:56 AM  Confirm and document reason for call. If symptomatic, describe symptoms. ---Caller states she is having a reaction to Vicodin. She is itching all over and has a rash on her legs and arms. Started vicodin yesterday. She has stopped the Vicodin. She is nothing to take for pain and would like Percocet.  Does the patient have any new or worsening symptoms? ---Yes  Will a triage be completed? ---Yes  Related visit to physician within the last 2 weeks? ---No  Does the PT have any chronic conditions? (i.e. diabetes, asthma, etc.) ---No  Is the patient pregnant or possibly pregnant? (Ask all females between the ages of 5912-55) ---No  Is this a behavioral health or substance abuse call? ---No     Guidelines    Guideline Title Affirmed Question Affirmed Notes  Rash - Widespread On Drugs Hives or itching    Final Disposition User   See Physician within 24 Hours EncinalStringer, RN, Dwana CurdVera    Comments  pt does not want to make appt. She has stopped the Vicodin and needs something else for the pain as she does not have anything to take. She has taken percocet before. Please call pt back regarding medication   Referrals  GO TO FACILITY REFUSED   Disagree/Comply: Disagree  Disagree/Comply Reason: Disagree with instructions

## 2016-10-23 NOTE — Telephone Encounter (Signed)
Pt called back stating she has no transportation here to bring bottle of vicodin she leaves in [place].  She wanted to know if she could be bottle back on Monday. Please call pt [phone]

## 2016-10-24 NOTE — Telephone Encounter (Signed)
No

## 2016-10-30 ENCOUNTER — Ambulatory Visit
Admission: EM | Admit: 2016-10-30 | Discharge: 2016-10-30 | Disposition: A | Discharge status: Home / Self Care | Payer: BLUE CROSS/BLUE SHIELD | Attending: Family Medicine | Admitting: Family Medicine

## 2016-10-30 ENCOUNTER — Other Ambulatory Visit: Payer: Self-pay | Admitting: Family Medicine

## 2016-10-30 DIAGNOSIS — M79672 Pain in left foot: Secondary | ICD-10-CM | POA: Diagnosis not present

## 2016-10-30 DIAGNOSIS — G90522 Complex regional pain syndrome I of left lower limb: Secondary | ICD-10-CM
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
## 2016-10-30 NOTE — Telephone Encounter (Signed)
Pt called back and I re advised pt that Dr Patsy Lager is out of office until 11/03/16 and I will send a note but if pt pain has worsened or changed she may need to go to UC. Pt said if she were to go to UC they will not give her med because it is too soon. Pt said she would wait on cb.

## 2016-10-30 NOTE — Telephone Encounter (Signed)
Pt left v/m; that pain is shooting up her leg now and that means her pain is worse. Pt request cb before closing. I left v/m requesting pt to cb.
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
## 2016-10-30 NOTE — Telephone Encounter (Signed)
I will call directly regarding opioids, controlled substance use and their relevance in complex regional pain syndrome.

## 2016-10-30 NOTE — Telephone Encounter (Signed)
Thank you so much

## 2016-10-30 NOTE — Telephone Encounter (Signed)
Pt called back and pt said no one called her back; I advised pt of Dr Copland's answer and pt said her throat feels like it is closing up; and when I told pt she needs to go to ED now since throat is closing up, pt said no it is not closing up now that was awhile ago; pt said she is OK now with her throat and since no one called her back she did not know when to come back and pick up her pills. I told pt I would send the note to Dr Patsy Lager and pt will wait for cb.
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
## 2016-10-30 NOTE — ED Triage Notes (Signed)
She says she is experiencing pain and swelling, because of CRPS she was dx at [facility] ED about 3 weeks ago. She saw her PCP and they are suppose to be getting her a pain management consult.
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
## 2016-10-31 NOTE — Telephone Encounter (Signed)
I have called the patient a lot of times without her answering her phone.   Can you set up a 30 minute office visit with her face to face for next Wednesday?  When she asks, I will not be giving her opiate pain medication, but I would like to have a face to face discussion with her.

## 2016-10-31 NOTE — Telephone Encounter (Signed)
I have again called the patient, but there is no answer or possibility to leave voicemail.

## 2016-10-31 NOTE — Telephone Encounter (Signed)
Pt left v/m; pt went to UC last night and UC did not help pt at all; tried to give pt toradol inj and that closed her throat up when she was in hospital; pt request cb at [phone].

## 2016-10-31 NOTE — Telephone Encounter (Signed)
Send back routing note to PCP

## 2016-11-02 ENCOUNTER — Encounter: Payer: Self-pay | Admitting: Family Medicine

## 2016-11-03 ENCOUNTER — Telehealth: Payer: Self-pay | Admitting: Family Medicine

## 2016-11-03 ENCOUNTER — Encounter: Payer: Self-pay | Admitting: Family Medicine

## 2016-11-03 NOTE — Telephone Encounter (Signed)
Pt left v/m requesting cb; per the 3:40 pm note Dr Patsy Lager wanted 30 min face to face visit 11/05/16. Since he has sent discharge letter does he still want the face to face with pt.

## 2016-11-03 NOTE — Telephone Encounter (Signed)
Patient dismissed from [facility] by [name] MD , effective November 03, 2016. Dismissal letter sent out by certified / registered mail.  DAJ

## 2016-11-03 NOTE — Telephone Encounter (Signed)
Discussed with Mrs. Isley - no. Dismissal letter has been sent at this point.

## 2016-11-03 NOTE — Telephone Encounter (Signed)
Formal discharge letter printed and sent for certified mail to patient.

## 2016-11-03 NOTE — Telephone Encounter (Signed)
I explained my concerns outlined below verbally to the patient. Explained reasoning for discharge from our medical practice including doctor shopping and violation of trust in the doctor-patient relationship.

## 2016-11-03 NOTE — Telephone Encounter (Signed)
Again attempted call. No answer. Easily more than 6 phone calls attempted without answer by patient.

## 2016-11-03 NOTE — Telephone Encounter (Signed)
Pt left v/m that she had missed a call and request cb.Please advise.

## 2016-11-24 NOTE — Telephone Encounter (Signed)
Noted  

## 2016-11-24 NOTE — Telephone Encounter (Signed)
Certified dismissal letter returned as undeliverable, unclaimed, return to sender after three attempts by [employer] on November 24, 2016. Letter placed in another envelope and resent as 1st class mail which does not require a signature. DAJ

## 2016-12-04 IMAGING — US US ABDOMEN COMPLETE
1 series · 14 of 25 positions shown · non-contrast
Comparison: CT scan 10/25/2014

CLINICAL DATA: Abdominal bloating, weight gain

EXAM:
ABDOMEN ULTRASOUND COMPLETE

[Series 1: us abdomen complete · 0.15mm/px · 14 of 121 slices shown]
[im 1/121]
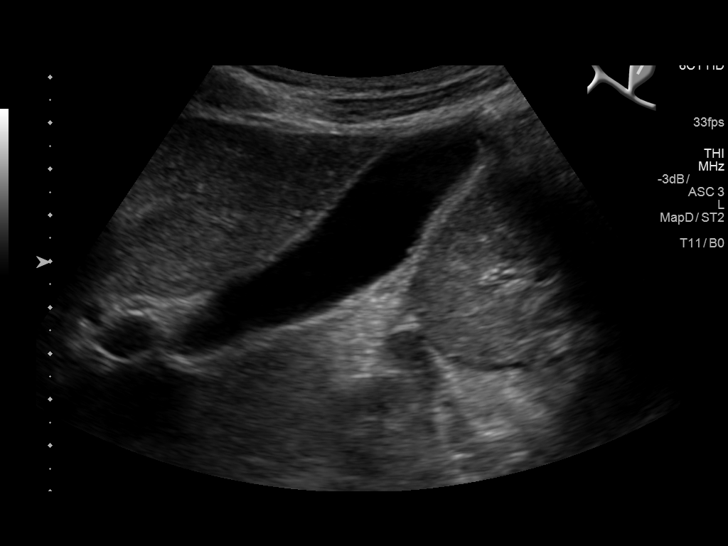
[im 11/121]
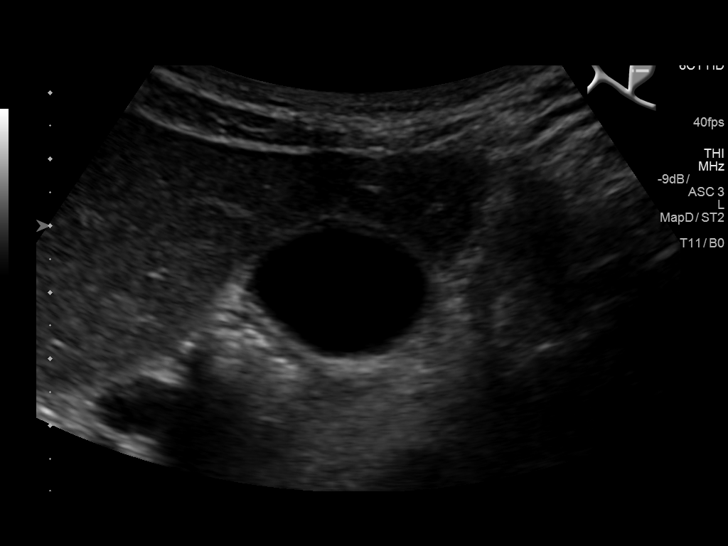
[im 21/121]
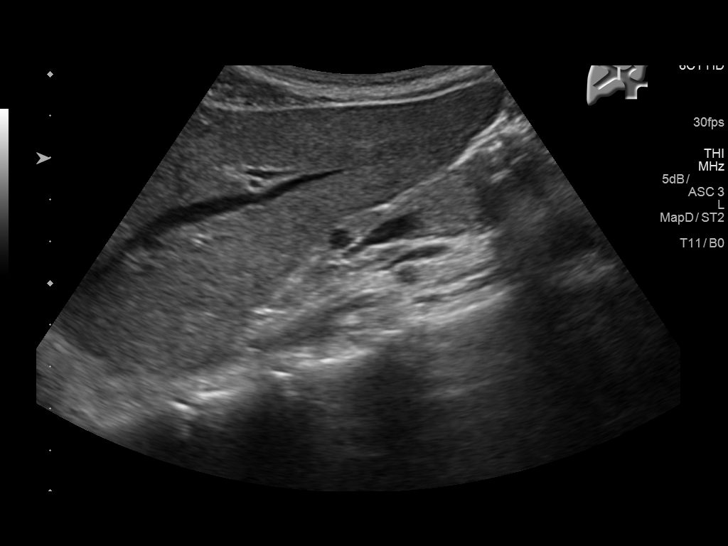
[im 31/121]
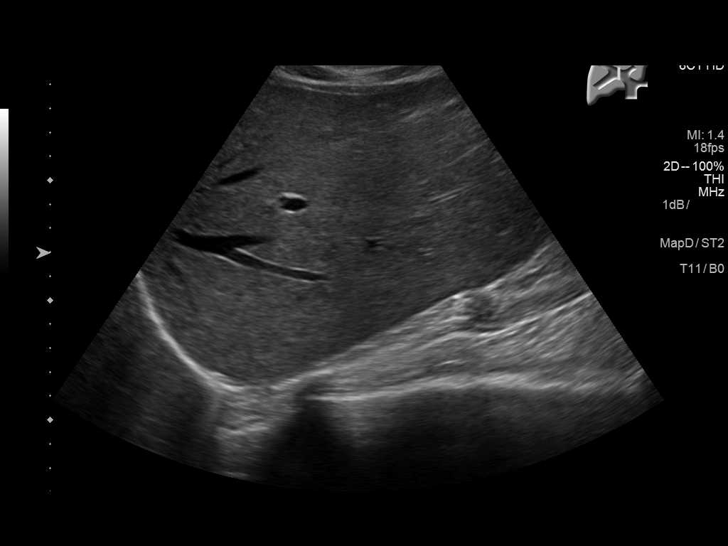
[im 41/121]
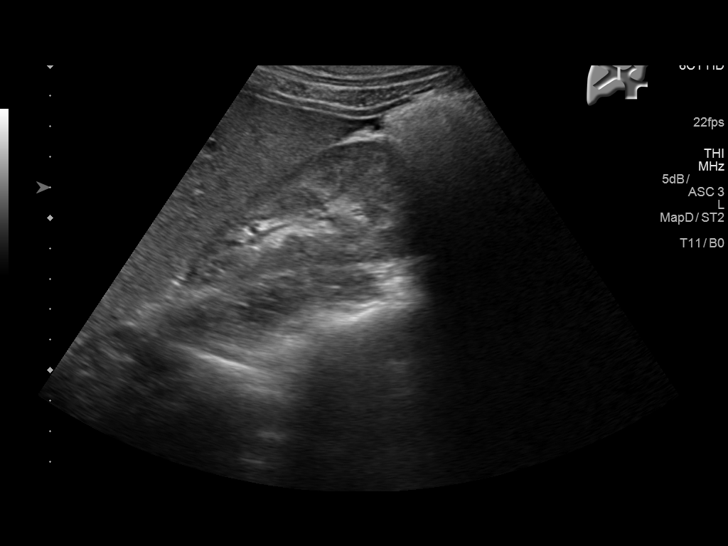
[im 46/121]
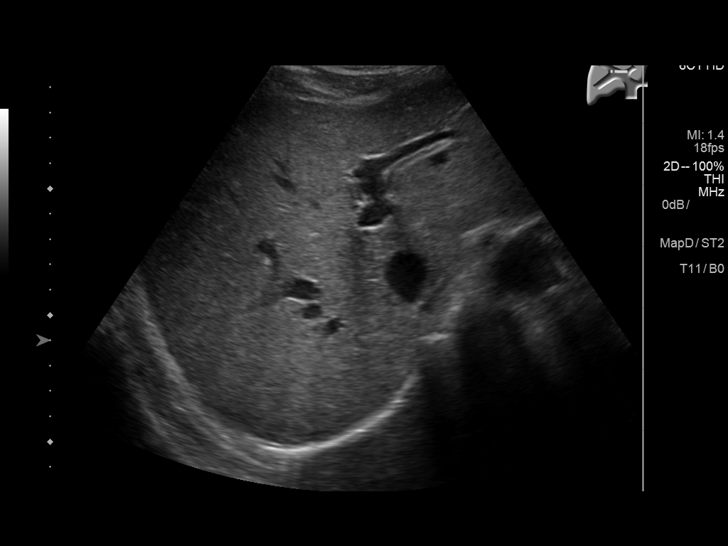
[im 56/121]
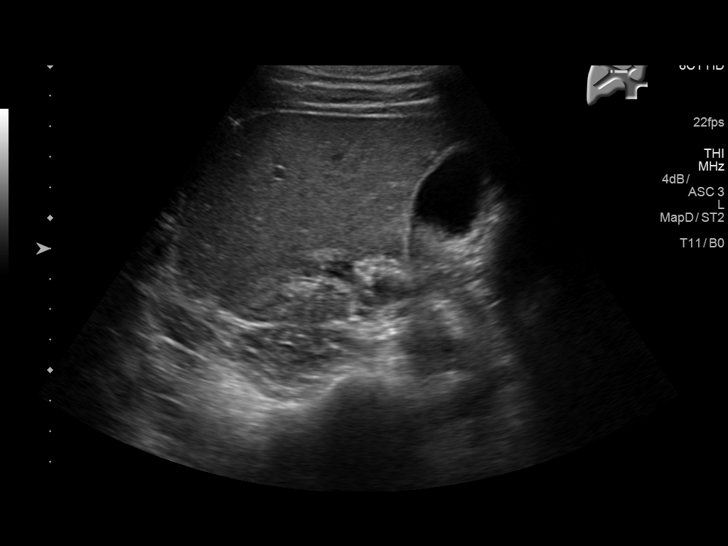
[im 66/121]
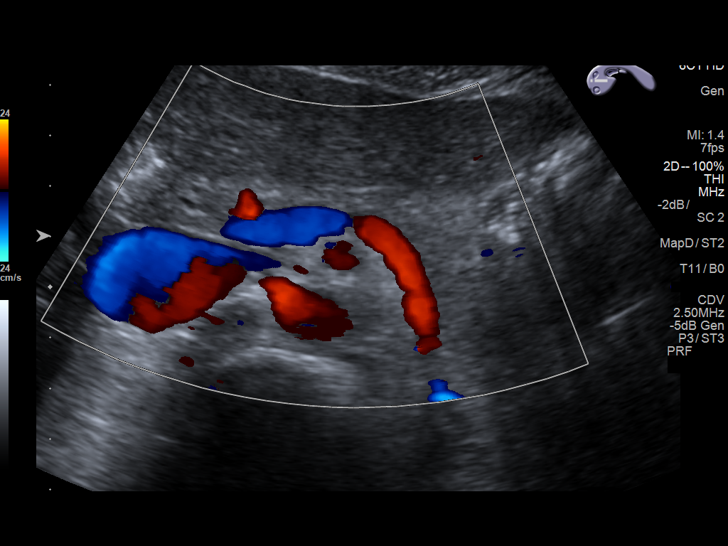
[im 76/121]
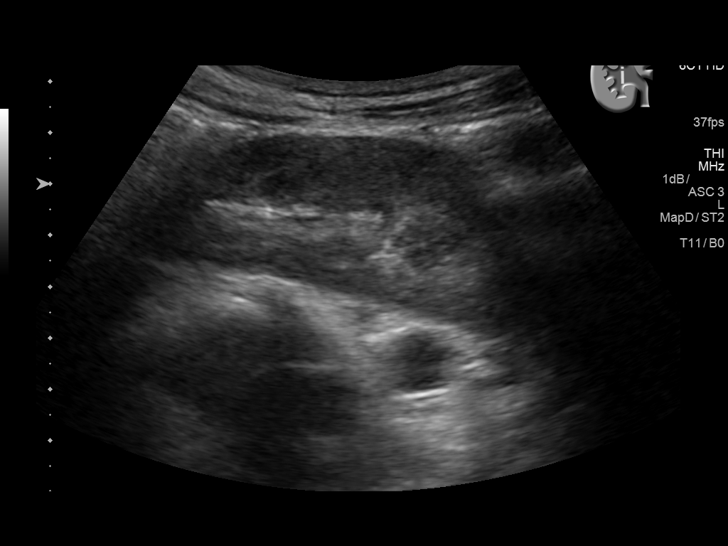
[im 81/121]
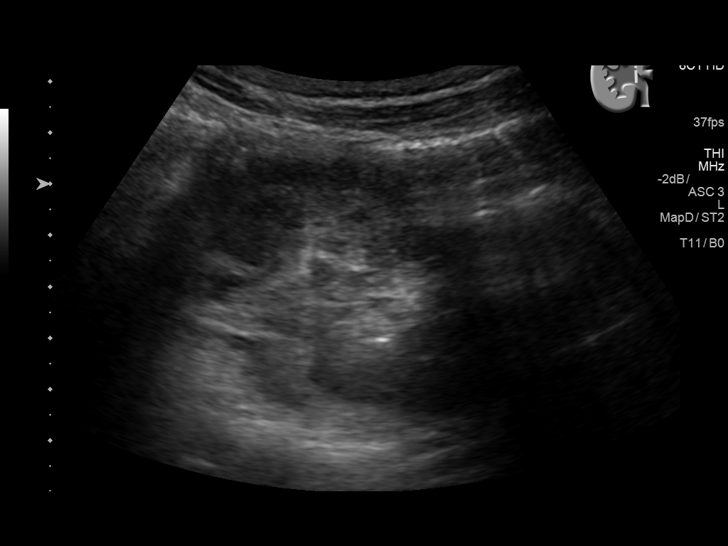
[im 91/121]
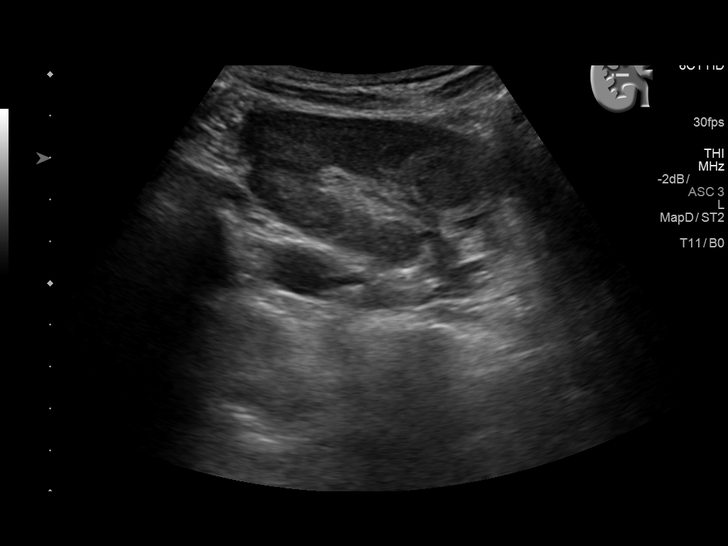
[im 101/121]
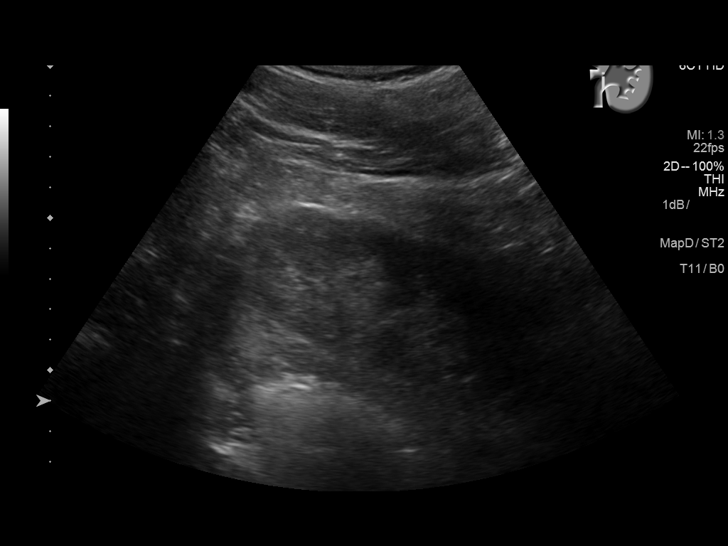
[im 111/121]
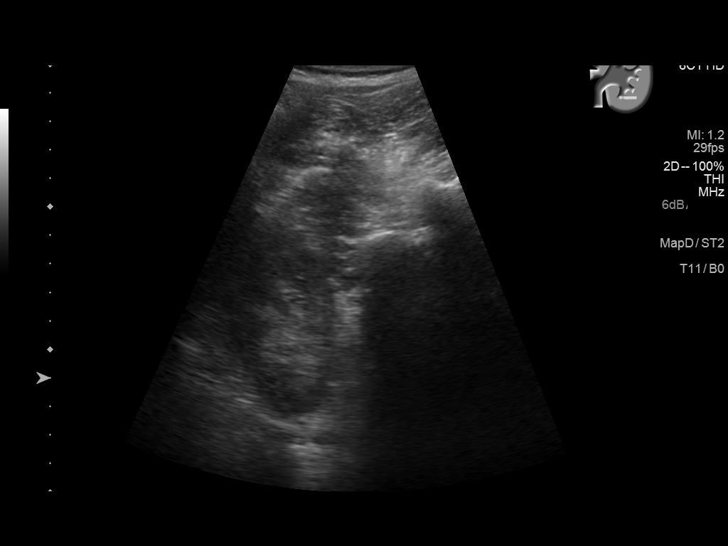
[im 121/121]
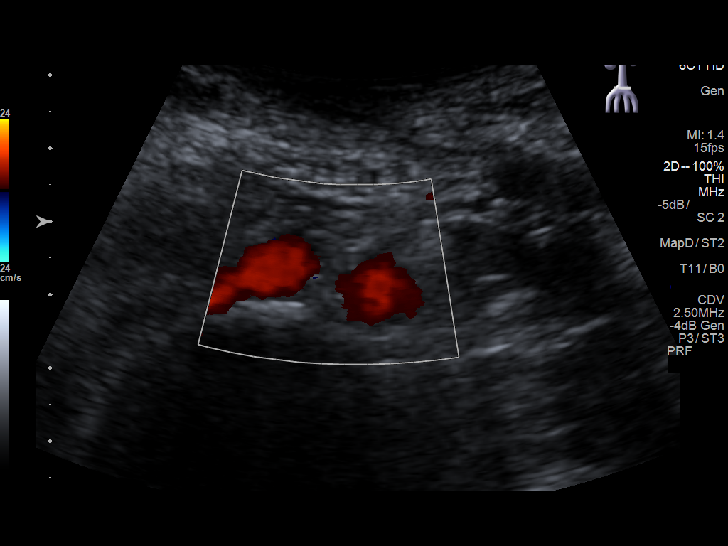

[14 of 25 positions shown; findings below may reference images not displayed]

FINDINGS: Gallbladder: No gallstones or wall thickening visualized. No
sonographic Murphy sign noted by sonographer.

Common bile duct: Diameter: 2.4 mm in diameter within normal limits

Liver: No focal lesion identified. Within normal limits in
parenchymal echogenicity.

IVC: No abnormality visualized.

Pancreas: Visualized portion unremarkable.

Spleen: Size and appearance within normal limits. Measures 5.6 cm in
length

Right Kidney: Length: 10.3 cm. Echogenicity within normal limits. No
mass or hydronephrosis visualized.

Left Kidney: Length: 10.9 cm. Echogenicity within normal limits. No
mass or hydronephrosis visualized.

Abdominal aorta: No aneurysm visualized. Measures up to 1.8 cm in
diameter.

Other findings: None.
IMPRESSION: Normal abdominal ultrasound.

## 2017-01-07 ENCOUNTER — Ambulatory Visit
Admission: EM | Admit: 2017-01-07 | Discharge: 2017-01-07 | Disposition: A | Discharge status: Home / Self Care | Payer: BLUE CROSS/BLUE SHIELD | Attending: Family Medicine | Admitting: Family Medicine

## 2017-01-07 ENCOUNTER — Encounter: Payer: Self-pay | Admitting: Emergency Medicine

## 2017-01-07 DIAGNOSIS — K0889 Other specified disorders of teeth and supporting structures: Secondary | ICD-10-CM | POA: Diagnosis not present

## 2017-01-07 NOTE — ED Triage Notes (Signed)
Patient c/o dental pain and swelling on the right side of her mouth.  Patient denies fevers.
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
## 2017-01-17 ENCOUNTER — Emergency Department: Payer: BLUE CROSS/BLUE SHIELD

## 2017-01-17 ENCOUNTER — Inpatient Hospital Stay
Admission: EM | Admit: 2017-01-17 | Discharge: 2017-01-21 | DRG: 872 | Disposition: A | Discharge status: Home / Self Care | Payer: BLUE CROSS/BLUE SHIELD | Attending: Internal Medicine | Admitting: Internal Medicine

## 2017-01-17 ENCOUNTER — Encounter: Payer: Self-pay | Admitting: *Deleted

## 2017-01-17 DIAGNOSIS — B002 Herpesviral gingivostomatitis and pharyngotonsillitis: Secondary | ICD-10-CM | POA: Diagnosis present

## 2017-01-17 DIAGNOSIS — A419 Sepsis, unspecified organism: Secondary | ICD-10-CM | POA: Diagnosis not present

## 2017-01-17 DIAGNOSIS — B9689 Other specified bacterial agents as the cause of diseases classified elsewhere: Secondary | ICD-10-CM | POA: Diagnosis present

## 2017-01-17 DIAGNOSIS — N760 Acute vaginitis: Secondary | ICD-10-CM | POA: Diagnosis present

## 2017-01-17 DIAGNOSIS — E031 Congenital hypothyroidism without goiter: Secondary | ICD-10-CM | POA: Diagnosis present

## 2017-01-17 DIAGNOSIS — B9620 Unspecified Escherichia coli [E. coli] as the cause of diseases classified elsewhere: Secondary | ICD-10-CM | POA: Diagnosis present

## 2017-01-17 DIAGNOSIS — N12 Tubulo-interstitial nephritis, not specified as acute or chronic: Secondary | ICD-10-CM | POA: Diagnosis present

## 2017-01-17 DIAGNOSIS — F419 Anxiety disorder, unspecified: Secondary | ICD-10-CM | POA: Diagnosis present

## 2017-01-17 DIAGNOSIS — F339 Major depressive disorder, recurrent, unspecified: Secondary | ICD-10-CM | POA: Diagnosis present

## 2017-01-17 DIAGNOSIS — E876 Hypokalemia: Secondary | ICD-10-CM | POA: Diagnosis present

## 2017-01-17 DIAGNOSIS — Z62810 Personal history of physical and sexual abuse in childhood: Secondary | ICD-10-CM | POA: Diagnosis present

## 2017-01-17 DIAGNOSIS — Z87891 Personal history of nicotine dependence: Secondary | ICD-10-CM

## 2017-01-17 DIAGNOSIS — N179 Acute kidney failure, unspecified: Secondary | ICD-10-CM | POA: Diagnosis present

## 2017-01-17 DIAGNOSIS — Z79899 Other long term (current) drug therapy: Secondary | ICD-10-CM

## 2017-01-17 DIAGNOSIS — F411 Generalized anxiety disorder: Secondary | ICD-10-CM | POA: Diagnosis present

## 2017-01-17 DIAGNOSIS — F410 Panic disorder [episodic paroxysmal anxiety] without agoraphobia: Secondary | ICD-10-CM | POA: Diagnosis present

## 2017-01-17 DIAGNOSIS — N390 Urinary tract infection, site not specified: Secondary | ICD-10-CM

## 2017-01-17 DIAGNOSIS — R1030 Lower abdominal pain, unspecified: Secondary | ICD-10-CM | POA: Diagnosis not present

## 2017-01-17 DIAGNOSIS — E871 Hypo-osmolality and hyponatremia: Secondary | ICD-10-CM | POA: Diagnosis present

## 2017-01-17 LAB — COMPREHENSIVE METABOLIC PANEL
ALBUMIN: 3.8 g/dL (ref 3.5–5.0)
ALK PHOS: 82 U/L (ref 38–126)
ALT: 14 U/L (ref 14–54)
AST: 21 U/L (ref 15–41)
Anion gap: 10 (ref 5–15)
BILIRUBIN TOTAL: 0.4 mg/dL (ref 0.3–1.2)
BUN: 38 mg/dL — ABNORMAL HIGH (ref 6–20)
CO2: 21 mmol/L — ABNORMAL LOW (ref 22–32)
Calcium: 8.6 mg/dL — ABNORMAL LOW (ref 8.9–10.3)
Chloride: 101 mmol/L (ref 101–111)
Creatinine, Ser: 1.16 mg/dL — ABNORMAL HIGH (ref 0.44–1.00)
GFR calc Af Amer: >60 mL/min (ref >60)
GFR calc non Af Amer: >60 mL/min (ref >60)
GLUCOSE: 136 mg/dL — AB (ref 65–99)
Potassium: 3.3 mmol/L — ABNORMAL LOW (ref 3.5–5.1)
Sodium: 132 mmol/L — ABNORMAL LOW (ref 135–145)
TOTAL PROTEIN: 7.7 g/dL (ref 6.5–8.1)

## 2017-01-17 LAB — CHLAMYDIA/NGC RT PCR (ARMC ONLY)
Chlamydia Tr: NOT DETECTED
N GONORRHOEAE: NOT DETECTED

## 2017-01-17 LAB — T4, FREE: FREE T4: 1.08 ng/dL (ref 0.61–1.12)

## 2017-01-17 LAB — MAGNESIUM: Magnesium: 1.9 mg/dL (ref 1.7–2.4)

## 2017-01-17 LAB — URINE DRUG SCREEN, QUALITATIVE (ARMC ONLY)
AMPHETAMINES, UR SCREEN: POSITIVE — AB
Barbiturates, Ur Screen: NOT DETECTED
Benzodiazepine, Ur Scrn: NOT DETECTED
COCAINE METABOLITE, UR ~~LOC~~: NOT DETECTED
Cannabinoid 50 Ng, Ur ~~LOC~~: NOT DETECTED
MDMA (ECSTASY) UR SCREEN: NOT DETECTED
METHADONE SCREEN, URINE: NOT DETECTED
Opiate, Ur Screen: NOT DETECTED
Phencyclidine (PCP) Ur S: NOT DETECTED
TRICYCLIC, UR SCREEN: POSITIVE — AB

## 2017-01-17 LAB — CBC WITH DIFFERENTIAL/PLATELET
BAND NEUTROPHILS: 0 %
BASOS ABS: 0.3 10*3/uL — AB (ref 0–0.1)
BASOS PCT: 1 %
Blasts: 0 %
EOS PCT: 0 %
Eosinophils Absolute: 0 10*3/uL (ref 0–0.7)
HCT: 33.5 % — ABNORMAL LOW (ref 35.0–47.0)
Hemoglobin: 11.6 g/dL — ABNORMAL LOW (ref 12.0–16.0)
LYMPHS ABS: 0.6 10*3/uL — AB (ref 1.0–3.6)
Lymphocytes Relative: 2 %
MCH: 32.8 pg (ref 26.0–34.0)
MCHC: 34.6 g/dL (ref 32.0–36.0)
MCV: 94.7 fL (ref 80.0–100.0)
METAMYELOCYTES PCT: 0 %
MONO ABS: 2.2 10*3/uL — AB (ref 0.2–0.9)
MYELOCYTES: 0 %
Monocytes Relative: 8 %
Neutro Abs: 24.7 10*3/uL — ABNORMAL HIGH (ref 1.4–6.5)
Neutrophils Relative %: 89 %
Other: 0 %
PLATELETS: 245 10*3/uL (ref 150–440)
Promyelocytes Absolute: 0 %
RBC: 3.53 MIL/uL — ABNORMAL LOW (ref 3.80–5.20)
RDW: 14.2 % (ref 11.5–14.5)
WBC: 27.8 10*3/uL — ABNORMAL HIGH (ref 3.6–11.0)
nRBC: 0 /100 WBC

## 2017-01-17 LAB — URINALYSIS, COMPLETE (UACMP) WITH MICROSCOPIC
Bilirubin Urine: NEGATIVE
Glucose, UA: NEGATIVE mg/dL
KETONES UR: NEGATIVE mg/dL
Nitrite: NEGATIVE
PH: 5 (ref 5.0–8.0)
Protein, ur: 100 mg/dL — AB
Specific Gravity, Urine: 1.013 (ref 1.005–1.030)

## 2017-01-17 LAB — LIPASE, BLOOD: Lipase: 18 U/L (ref 11–51)

## 2017-01-17 LAB — LACTIC ACID, PLASMA: Lactic Acid, Venous: 1.2 mmol/L (ref 0.5–1.9)

## 2017-01-17 LAB — WET PREP, GENITAL
Sperm: NONE SEEN
Trich, Wet Prep: NONE SEEN
Yeast Wet Prep HPF POC: NONE SEEN

## 2017-01-17 LAB — PROCALCITONIN: PROCALCITONIN: 3.6 ng/mL

## 2017-01-17 LAB — PROTIME-INR
INR: 1.11
Prothrombin Time: 14.4 seconds (ref 11.4–15.2)

## 2017-01-17 LAB — TROPONIN I: Troponin I: <0.03 ng/mL (ref <0.03)

## 2017-01-17 LAB — TSH: TSH: 3.961 u[IU]/mL (ref 0.350–4.500)

## 2017-01-17 LAB — APTT: aPTT: 39 seconds — ABNORMAL HIGH (ref 24–36)

## 2017-01-17 LAB — POCT PREGNANCY, URINE: PREG TEST UR: NEGATIVE

## 2017-01-17 LAB — PHOSPHORUS: Phosphorus: 2.2 mg/dL — ABNORMAL LOW (ref 2.5–4.6)

## 2017-01-17 MED ORDER — IOPAMIDOL (ISOVUE-300) INJECTION 61%
30.0000 mL | Freq: Once | INTRAVENOUS | Status: AC | PRN
  Administered 2017-01-17: 30 mL via ORAL

## 2017-01-17 MED ORDER — GABAPENTIN 600 MG PO TABS
600.0000 mg | ORAL_TABLET | Freq: Three times a day (TID) | ORAL | Status: DC
  Administered 2017-01-17 – 2017-01-21 (×11): 600 mg via ORAL
  Filled 2017-01-17 (×11): qty 1

## 2017-01-17 MED ORDER — VANCOMYCIN HCL IN DEXTROSE 750-5 MG/150ML-% IV SOLN
750.0000 mg | Freq: Two times a day (BID) | INTRAVENOUS | Status: DC
  Administered 2017-01-18: 750 mg via INTRAVENOUS
  Filled 2017-01-17 (×2): qty 150

## 2017-01-17 MED ORDER — MAGNESIUM CITRATE PO SOLN
1.0000 | Freq: Once | ORAL | Status: DC | PRN
  Filled 2017-01-17: qty 296

## 2017-01-17 MED ORDER — SODIUM CHLORIDE 0.9 % IV BOLUS (SEPSIS)
333.0000 mL | Freq: Once | INTRAVENOUS | Status: AC
  Administered 2017-01-17: 333 mL via INTRAVENOUS

## 2017-01-17 MED ORDER — QUETIAPINE FUMARATE 200 MG PO TABS
200.0000 mg | ORAL_TABLET | Freq: Every day | ORAL | Status: DC
  Administered 2017-01-18 – 2017-01-19 (×2): 200 mg via ORAL
  Filled 2017-01-17 (×2): qty 1

## 2017-01-17 MED ORDER — LEVOTHYROXINE SODIUM 50 MCG PO TABS
200.0000 ug | ORAL_TABLET | Freq: Every day | ORAL | Status: DC
  Administered 2017-01-18 – 2017-01-21 (×4): 200 ug via ORAL
  Filled 2017-01-17 (×5): qty 4

## 2017-01-17 MED ORDER — ACETAMINOPHEN 500 MG PO TABS
1000.0000 mg | ORAL_TABLET | ORAL | Status: AC
  Administered 2017-01-17: 1000 mg via ORAL

## 2017-01-17 MED ORDER — VANCOMYCIN HCL IN DEXTROSE 1-5 GM/200ML-% IV SOLN
1000.0000 mg | Freq: Once | INTRAVENOUS | Status: AC
  Administered 2017-01-17: 1000 mg via INTRAVENOUS
  Filled 2017-01-17: qty 200

## 2017-01-17 MED ORDER — PIPERACILLIN-TAZOBACTAM 3.375 G IVPB
3.3750 g | Freq: Three times a day (TID) | INTRAVENOUS | Status: DC
  Administered 2017-01-18 – 2017-01-20 (×8): 3.375 g via INTRAVENOUS
  Filled 2017-01-17 (×12): qty 50

## 2017-01-17 MED ORDER — EPINEPHRINE 0.3 MG/0.3ML IJ SOAJ
INTRAMUSCULAR | Status: AC
  Filled 2017-01-17: qty 0.3

## 2017-01-17 MED ORDER — ONDANSETRON HCL 4 MG/2ML IJ SOLN: 4.0000 mg | Freq: Four times a day (QID) | INTRAMUSCULAR | Status: DC | PRN

## 2017-01-17 MED ORDER — IOPAMIDOL (ISOVUE-300) INJECTION 61%
100.0000 mL | Freq: Once | INTRAVENOUS | Status: AC | PRN
  Administered 2017-01-17: 100 mL via INTRAVENOUS

## 2017-01-17 MED ORDER — IPRATROPIUM-ALBUTEROL 0.5-2.5 (3) MG/3ML IN SOLN
3.0000 mL | Freq: Once | RESPIRATORY_TRACT | Status: AC
  Administered 2017-01-17: 3 mL via RESPIRATORY_TRACT

## 2017-01-17 MED ORDER — BISACODYL 5 MG PO TBEC: 5.0000 mg | DELAYED_RELEASE_TABLET | Freq: Every day | ORAL | Status: DC | PRN

## 2017-01-17 MED ORDER — ACETAMINOPHEN 650 MG RE SUPP: 650.0000 mg | Freq: Four times a day (QID) | RECTAL | Status: DC | PRN

## 2017-01-17 MED ORDER — IPRATROPIUM BROMIDE 0.02 % IN SOLN: 0.5000 mg | Freq: Four times a day (QID) | RESPIRATORY_TRACT | Status: DC | PRN

## 2017-01-17 MED ORDER — PIPERACILLIN-TAZOBACTAM 3.375 G IVPB 30 MIN
3.3750 g | Freq: Once | INTRAVENOUS | Status: AC
  Administered 2017-01-17: 3.375 g via INTRAVENOUS
  Filled 2017-01-17: qty 50

## 2017-01-17 MED ORDER — AMITRIPTYLINE HCL 50 MG PO TABS
25.0000 mg | ORAL_TABLET | Freq: Every day | ORAL | Status: DC
  Administered 2017-01-17 – 2017-01-20 (×3): 25 mg via ORAL
  Filled 2017-01-17 (×5): qty 0.5

## 2017-01-17 MED ORDER — OXCARBAZEPINE 300 MG PO TABS
300.0000 mg | ORAL_TABLET | Freq: Four times a day (QID) | ORAL | Status: DC
  Administered 2017-01-17 – 2017-01-21 (×14): 300 mg via ORAL
  Filled 2017-01-17 (×17): qty 1

## 2017-01-17 MED ORDER — SODIUM CHLORIDE 0.9 % IV BOLUS (SEPSIS)
2000.0000 mL | Freq: Once | INTRAVENOUS | Status: AC
  Administered 2017-01-17: 2000 mL via INTRAVENOUS

## 2017-01-17 MED ORDER — ACETAMINOPHEN 325 MG PO TABS
650.0000 mg | ORAL_TABLET | Freq: Four times a day (QID) | ORAL | Status: DC | PRN
  Administered 2017-01-17 – 2017-01-19 (×3): 650 mg via ORAL
  Filled 2017-01-17 (×3): qty 2

## 2017-01-17 MED ORDER — POTASSIUM CHLORIDE 10 MEQ/100ML IV SOLN
10.0000 meq | INTRAVENOUS | Status: AC
  Administered 2017-01-17 – 2017-01-18 (×3): 10 meq via INTRAVENOUS
  Filled 2017-01-17 (×3): qty 100

## 2017-01-17 MED ORDER — ALBUTEROL SULFATE (2.5 MG/3ML) 0.083% IN NEBU: 2.5000 mg | INHALATION_SOLUTION | Freq: Four times a day (QID) | RESPIRATORY_TRACT | Status: DC | PRN

## 2017-01-17 MED ORDER — IPRATROPIUM-ALBUTEROL 0.5-2.5 (3) MG/3ML IN SOLN
RESPIRATORY_TRACT | Status: AC
  Filled 2017-01-17: qty 3

## 2017-01-17 MED ORDER — HYDROXYZINE PAMOATE 50 MG PO CAPS
50.0000 mg | ORAL_CAPSULE | Freq: Four times a day (QID) | ORAL | Status: DC
  Administered 2017-01-17 – 2017-01-21 (×14): 50 mg via ORAL
  Filled 2017-01-17 (×17): qty 1

## 2017-01-17 MED ORDER — ONDANSETRON HCL 4 MG PO TABS: 4.0000 mg | ORAL_TABLET | Freq: Four times a day (QID) | ORAL | Status: DC | PRN

## 2017-01-17 MED ORDER — SENNOSIDES-DOCUSATE SODIUM 8.6-50 MG PO TABS: 1.0000 | ORAL_TABLET | Freq: Every evening | ORAL | Status: DC | PRN

## 2017-01-17 MED ORDER — ACETAMINOPHEN 500 MG PO TABS
ORAL_TABLET | ORAL | Status: AC
  Administered 2017-01-17: 1000 mg via ORAL
  Filled 2017-01-17: qty 2

## 2017-01-17 MED ORDER — SODIUM CHLORIDE 0.9 % IV SOLN
INTRAVENOUS | Status: DC
  Administered 2017-01-17 – 2017-01-20 (×7): via INTRAVENOUS

## 2017-01-17 NOTE — ED Notes (Signed)
Patient transported to CT 

## 2017-01-17 NOTE — ED Notes (Signed)
Patient called out to c/o nausea.  RN requested patient to pause drinking contrast until MD is alerted.

## 2017-01-17 NOTE — ED Notes (Signed)
Patient's BP trending down. RN to bedside. Pt c/o chest discomfort/SOB. Wheezes heard on auscultation.  MD informed. MD to bedside.
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
## 2017-01-17 NOTE — Progress Notes (Signed)
Pharmacy Antibiotic Note  Martha Proctor is a 26 y.o. female admitted on 01/17/2017 with Possible urosepsis.  Pharmacy has been consulted for vanc/zosyn dosing.  Plan: Patient received vanc 1g IV x 1 in ED  Will follow up w/ vanc maintenance of 750 mg IV q12h w/ 8 hour stack dose to start 6/24 @ 0500. Will draw a vanc trough 6/25 @ 1600 prior to 4th dose. Ke 0.0576 T1/2 12 hours Goal trough 15 - 20 mcg/mL Css 19 mcg/mL  Will start zosyn 3.375g IV q8h extended infusion. Please follow-up on culture/sensitivities to narrow therapy.  Height: 5\' 4"  (162.6 cm) Weight: 120 lb (54.4 kg) IBW/kg (Calculated) : 54.7  Temp (24hrs), Avg:101.4 F (38.6 C), Min:99.5 F (37.5 C), Max:103.3 F (39.6 C)   Recent Labs Lab 01/17/17 1731  WBC 27.8*  CREATININE 1.16*  LATICACIDVEN 1.2    Estimated Creatinine Clearance: 64.2 mL/min (A) (by C-G formula based on SCr of 1.16 mg/dL (H)).    Allergies  Allergen Reactions  . Toradol [Ketorolac Tromethamine] Anaphylaxis  . Tramadol Nausea And Vomiting   Patient meets 4/4 SIRs criteria and 2/3 qSOFA and is also hypotensive. Will treat with broad coverage and will narrow therapy once cx/sensivities result.  Thank you for allowing pharmacy to be a part of this patient's care.  Thomasene Rippleavid Jed Kutch, PharmD, BCPS Clinical Pharmacist 01/17/2017

## 2017-01-17 NOTE — ED Triage Notes (Signed)
Pt to triage via wheelchair. Pt reports a fever and lower abd pain with spotting.  Pt states her lower back is cramping.   Pt alert.

## 2017-01-17 NOTE — ED Notes (Signed)
MD informed of patient's pain. No meds ordered at this time. Patient informed.
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
## 2017-01-18 LAB — CBC
HEMATOCRIT: 28.6 % — AB (ref 35.0–47.0)
HEMOGLOBIN: 9.6 g/dL — AB (ref 12.0–16.0)
MCH: 31.9 pg (ref 26.0–34.0)
MCHC: 33.5 g/dL (ref 32.0–36.0)
MCV: 95.2 fL (ref 80.0–100.0)
Platelets: 226 10*3/uL (ref 150–440)
RBC: 3.01 MIL/uL — ABNORMAL LOW (ref 3.80–5.20)
RDW: 14.5 % (ref 11.5–14.5)
WBC: 26.6 10*3/uL — ABNORMAL HIGH (ref 3.6–11.0)

## 2017-01-18 LAB — BASIC METABOLIC PANEL
ANION GAP: 5 (ref 5–15)
BUN: 23 mg/dL — AB (ref 6–20)
CO2: 24 mmol/L (ref 22–32)
Calcium: 7.4 mg/dL — ABNORMAL LOW (ref 8.9–10.3)
Chloride: 107 mmol/L (ref 101–111)
Creatinine, Ser: 0.97 mg/dL (ref 0.44–1.00)
GFR calc Af Amer: >60 mL/min (ref >60)
GFR calc non Af Amer: >60 mL/min (ref >60)
Glucose, Bld: 152 mg/dL — ABNORMAL HIGH (ref 65–99)
POTASSIUM: 3.6 mmol/L (ref 3.5–5.1)
Sodium: 136 mmol/L (ref 135–145)

## 2017-01-18 LAB — LACTIC ACID, PLASMA: Lactic Acid, Venous: 2.2 mmol/L (ref 0.5–1.9)

## 2017-01-18 MED ORDER — SODIUM CHLORIDE 0.9 % IV BOLUS (SEPSIS)
500.0000 mL | Freq: Once | INTRAVENOUS | Status: AC
  Administered 2017-01-18: 500 mL via INTRAVENOUS

## 2017-01-18 MED ORDER — SODIUM CHLORIDE 0.9 % IV BOLUS (SEPSIS)
1000.0000 mL | Freq: Once | INTRAVENOUS | Status: AC
  Administered 2017-01-18: 1000 mL via INTRAVENOUS

## 2017-01-18 MED ORDER — OXYCODONE-ACETAMINOPHEN 5-325 MG PO TABS
1.0000 | ORAL_TABLET | ORAL | Status: DC | PRN
  Administered 2017-01-18 – 2017-01-19 (×3): 1 via ORAL
  Administered 2017-01-19: 2 via ORAL
  Administered 2017-01-19: 1 via ORAL
  Administered 2017-01-20 (×2): 2 via ORAL
  Administered 2017-01-20: 1 via ORAL
  Administered 2017-01-20 – 2017-01-21 (×5): 2 via ORAL
  Filled 2017-01-18 (×4): qty 2
  Filled 2017-01-18 (×3): qty 1
  Filled 2017-01-18 (×2): qty 2
  Filled 2017-01-18: qty 1
  Filled 2017-01-18 (×3): qty 2

## 2017-01-18 MED ORDER — OXYCODONE-ACETAMINOPHEN 5-325 MG PO TABS
1.0000 | ORAL_TABLET | Freq: Four times a day (QID) | ORAL | Status: DC | PRN
  Administered 2017-01-18: 1 via ORAL
  Filled 2017-01-18 (×2): qty 1

## 2017-01-18 MED ORDER — ACYCLOVIR 200 MG PO CAPS
200.0000 mg | ORAL_CAPSULE | Freq: Every day | ORAL | Status: DC
  Administered 2017-01-18 – 2017-01-21 (×17): 200 mg via ORAL
  Filled 2017-01-18 (×21): qty 1

## 2017-01-18 NOTE — Progress Notes (Signed)
Md paged regarding bp 80/35

## 2017-01-18 NOTE — Progress Notes (Signed)
Notified Dr. Elisabeth PigeonVachhani of pt continued c/o pain. Dr ordered to increase frequency of pain medication to Q4 hrs. Dr stated he did not wish to add IV pain medication. Will continue to monitor

## 2017-01-18 NOTE — Progress Notes (Signed)
Chaplain received OR to visit patient in 210. Patient was asleep.  *Follow-up- Patient was beginning physical therapy in her room  *Follow-up- patient was asleep. Chaplain left A.D. with family member whom understood the purpose, agreed to share with patient, and have patient contact [facility] if more information is desired. Also [facility]     01/18/17 1230  Clinical Encounter Type  Visited With Patient and family together  Visit Type Follow-up  Referral From Nurse  Consult/Referral To Chaplain  Spiritual Encounters  Spiritual Needs Other (Comment)

## 2017-01-18 NOTE — Progress Notes (Signed)
PT Cancellation Note  Patient Details Name: Skip EstimableCiara Nicole Ramroop MRN: [id number] DOB: 13-Jul-1991   Cancelled Treatment:    Reason Eval/Treat Not Completed: Patient declined, no reason specified. Evaluation attempted at 1030 with patient's boyfriend at bedside. Patient reportedly lives in apartment with boyfriend and occasionally has utilized RW since recent diagnosis of CRPS. Is admittedly not driving/working currently. Upon uncovering patient, she repeatedly refused movement assessment at this time. Will check back later if time permits.   Neita CarpJulie Ann Zakkiyya Barno, PT, DPT 01/18/2017, 10:40 AM

## 2017-01-18 NOTE — Progress Notes (Signed)
Pharmacy Antibiotic Note  Shyvonne Carlean Purlicole Muckleroy is a 26 y.o. female admitted on 01/17/2017 with Herpes orolabial (cold sores).  Pharmacy has been consulted for acyclovir dosing.  Plan: Patient is being started on acyclovir for oral herpes  Will start acyclovir 200 mg PO daily for 5 days for oral herpes treatment.  Height: 5\' 4"  (162.6 cm) Weight: 120 lb (54.4 kg) IBW/kg (Calculated) : 54.7  Temp (24hrs), Avg:100.3 F (37.9 C), Min:98.1 F (36.7 C), Max:103.3 F (39.6 C)   Recent Labs Lab 01/17/17 1731 01/18/17 0016  WBC 27.8*  --   CREATININE 1.16*  --   LATICACIDVEN 1.2 2.2*    Estimated Creatinine Clearance: 64.2 mL/min (A) (by C-G formula based on SCr of 1.16 mg/dL (H)).    Allergies  Allergen Reactions  . Toradol [Ketorolac Tromethamine] Anaphylaxis  . Tramadol Nausea And Vomiting   Thank you for allowing pharmacy to be a part of this patient's care.  Thomasene Rippleavid Raven Harmes, PharmD, BCPS Clinical Pharmacist 01/18/2017

## 2017-01-18 NOTE — Progress Notes (Signed)
Dr Renato ShinHugelmeier notified 1/2 bolus giveb bp 100/30, pt drowsy arousable when shaken opens eyes, right back to sleep, acknowledged, recheck BP when bolus finished

## 2017-01-18 NOTE — Progress Notes (Signed)
MD notified of low BP. 500cc NS bolus ordered

## 2017-01-18 NOTE — Progress Notes (Signed)
MD notified of critical lactic acid

## 2017-01-18 NOTE — Progress Notes (Signed)
[facility] [facility] [place] at [facility]   PATIENT NAME: Martha Proctor    MR#:  [id number]  DATE OF BIRTH:  1990-10-03  SUBJECTIVE:  CHIEF COMPLAINT:   Chief Complaint  Patient presents with  . Fever  . Abdominal Pain     Came with abdominal pain and fever and found to have bilateral pyelonephritis, admitted with sepsis.  REVIEW OF SYSTEMS:  CONSTITUTIONAL: No fever, fatigue or weakness.  EYES: No blurred or double vision.  EARS, NOSE, AND THROAT: No tinnitus or ear pain.  RESPIRATORY: No cough, shortness of breath, wheezing or hemoptysis.  CARDIOVASCULAR: No chest pain, orthopnea, edema.  GASTROINTESTINAL: No nausea, vomiting, diarrhea or abdominal pain.  GENITOURINARY: No dysuria, hematuria.  ENDOCRINE: No polyuria, nocturia,  HEMATOLOGY: No anemia, easy bruising or bleeding SKIN: No rash or lesion. MUSCULOSKELETAL: No joint pain or arthritis.   NEUROLOGIC: No tingling, numbness, weakness.  PSYCHIATRY: No anxiety or depression.   ROS  DRUG ALLERGIES:   Allergies  Allergen Reactions  . Toradol [Ketorolac Tromethamine] Anaphylaxis  . Tramadol Nausea And Vomiting    VITALS:  Blood pressure (!) 103/42, pulse (!) 122, temperature (!) 103.4 F (39.7 C), temperature source Oral, resp. rate (!) 24, height 5\' 4"  (1.626 m), weight 54.4 kg (120 lb), SpO2 90 %.  PHYSICAL EXAMINATION:  GENERAL:  26 y.o.-year-old patient lying in the bed with no acute distress.  EYES: Pupils equal, round, reactive to light and accommodation. No scleral icterus. Extraocular muscles intact.  HEENT: Head atraumatic, normocephalic. Oropharynx and nasopharynx clear.  NECK:  Supple, no jugular venous distention. No thyroid enlargement, no tenderness.  LUNGS: Normal breath sounds bilaterally, no wheezing, rales,rhonchi or crepitation. No use of accessory muscles of respiration.  CARDIOVASCULAR: S1, S2 normal. No murmurs, rubs, or gallops.  ABDOMEN: Soft, nontender, nondistended. Bowel  sounds present. No organomegaly or mass. Some tenderness on back. EXTREMITIES: No pedal edema, cyanosis, or clubbing.  NEUROLOGIC: Cranial nerves II through XII are intact. Muscle strength 5/5 in all extremities. Sensation intact. Gait not checked.  PSYCHIATRIC: The patient is alert and oriented x 3.  SKIN: No obvious rash, lesion, or ulcer.   Physical Exam LABORATORY PANEL:   CBC  Recent Labs Lab 01/18/17 0425  WBC 26.6*  HGB 9.6*  HCT 28.6*  PLT 226   ------------------------------------------------------------------------------------------------------------------  Chemistries   Recent Labs Lab 01/17/17 1731 01/18/17 0425  NA 132* 136  K 3.3* 3.6  CL 101 107  CO2 21* 24  GLUCOSE 136* 152*  BUN 38* 23*  CREATININE 1.16* 0.97  CALCIUM 8.6* 7.4*  MG 1.9  --   AST 21  --   ALT 14  --   ALKPHOS 82  --   BILITOT 0.4  --    ------------------------------------------------------------------------------------------------------------------  Cardiac Enzymes  Recent Labs Lab 01/17/17 1731  TROPONINI <0.03   ------------------------------------------------------------------------------------------------------------------  RADIOLOGY:  Ct Abdomen Pelvis W Contrast  Result Date: 01/17/2017 CLINICAL DATA:  Fever and lower abdominal pain with spotting. Low back pain cramping. EXAM: CT ABDOMEN AND PELVIS WITH CONTRAST TECHNIQUE: Multidetector CT imaging of the abdomen and pelvis was performed using the standard protocol following bolus administration of intravenous contrast. CONTRAST:  ISOVUE-300 IOPAMIDOL (ISOVUE-300) INJECTION 61% COMPARISON:  October 25, 2014 contrast-enhanced CT. FINDINGS: Lower chest: No acute abnormality. Hepatobiliary: The liver and portal vein are normal. Gallbladder is normal in appearance but there is pericholecystic fluid. No wall thickening. Pancreas: Unremarkable. No pancreatic ductal dilatation or surrounding inflammatory changes. Spleen:  Normal in size  without focal abnormality. Adrenals/Urinary Tract: The adrenal glands are normal. Multiple regions of subtle decreased peripheral attenuation are seen in the kidneys on today's study but not the previous study. There is also minimal adjacent stranding posteriorly such as on image 32 on the left and image 41 on the right. There is the appearance of a subtle striated nephrogram on coronal images. No hydronephrosis on the right. The right ureter is mildly prominent but third of stones. No new hydronephrosis on the left. The left ureter is mildly prominent with no stones. The bladder is normal. Stomach/Bowel: The stomach and small bowel are normal. The colon is normal. The cecum lies within the right side of the pelvis. The appendix is difficult to see in its entirety but there is no secondary evidence of appendicitis. Vascular/Lymphatic: The aorta, iliac vessels, and femoral vessels are normal. There are a few shotty nodes in the retroperitoneum, left greater than right, likely reactive. Reproductive: Uterus and bilateral adnexa are unremarkable. Other: No abdominal wall hernia or abnormality. No abdominopelvic ascites. Musculoskeletal: No acute or significant osseous findings. IMPRESSION: 1. The kidneys are abnormal in appearance with bilateral striated nephrograms most consistent with bilateral pyelonephritis. Recommend clinical correlation and correlation with urinalysis. 2. The gallbladder is normal in appearance but there is pericholecystic fluid. If there is concern for acute cholecystitis, recommend an ultrasound. 3. No other acute abnormalities. Electronically Signed   By: Gerome Samavid  Williams III M.D   On: 01/17/2017 20:40   Dg Chest Port 1 View  Result Date: 01/17/2017 CLINICAL DATA:  Fever and lower abdominal pain with spotting starting today. Low back is cramping. EXAM: PORTABLE CHEST 1 VIEW COMPARISON:  Chest x-ray dated 07/14/2016. FINDINGS: Cardiomediastinal silhouette is normal in size and  configuration. Lungs are clear. Lung volumes are normal. No evidence of pneumonia. No pleural effusion. No pneumothorax. Osseous and soft tissue structures about the chest are unremarkable. IMPRESSION: Normal chest x-ray.  No evidence of pneumonia or pulmonary edema. Electronically Signed   By: Bary RichardStan  Maynard M.D.   On: 01/17/2017 18:19    ASSESSMENT AND PLAN:   Active Problems:   Sepsis secondary to UTI [facility](HCC)  This is a 26 y.o. female with a history of hypothyroidism, generalized anxiety disorder, depression, polysubstance abuse now :  #. Sepsis secondary to bilateral pyelonephritis - IV antibiotics: Vanco, Zosyn - IV fluid hydration -Pain control and antiemetics as needed - Follow up blood,urine cultures - Repeat CBC in am.  - We will DC Vanc as no other source of infection then UTI.  #. Hypokalemia and hyponatremia - Replace IV - recheck BMP in a.m.  #. Acute kidney injury likely secondary to #1 - IV fluids and repeat BMP in AM.  - Avoid nephrotoxic medications - Improved.  #. Active oral HSV lesions - Oral acyclovir per pharmacy  #. History ofanxiety and depression - CoContinue  Gabapentin, hydroxyzine, Seroquel, Elavil  #. History of  polysubstance abuse - Checked urine drug screen   All the records are reviewed and case discussed with Care Management/Social Workerr. Management plans discussed with the patient, family and they are in agreement.  CODE STATUS: full  TOTAL TIME TAKING CARE OF THIS PATIENT: 35 minutes.   POSSIBLE D/C IN 1-2 DAYS, DEPENDING ON CLINICAL CONDITION.   Altamese DillingVACHHANI, Finneas Mathe M.D on 01/18/2017   Between 7am to 6pm - Pager - [phone]  After 6pm go to [email or web address] - password [email or web address]  [facility]  Office  [phone]  CC: Primary care physician; Patient, No  Pcp Per  Note: This dictation was prepared with Dragon dictation along with smaller phrase technology. Any transcriptional errors that result from  this process are unintentional.

## 2017-01-19 LAB — T3, FREE: T3 FREE: 1.5 pg/mL — AB (ref 2.0–4.4)

## 2017-01-19 LAB — URINE DRUG SCREEN, QUALITATIVE (ARMC ONLY)
AMPHETAMINES, UR SCREEN: POSITIVE — AB
BARBITURATES, UR SCREEN: NOT DETECTED
BENZODIAZEPINE, UR SCRN: NOT DETECTED
Cannabinoid 50 Ng, Ur ~~LOC~~: NOT DETECTED
Cocaine Metabolite,Ur ~~LOC~~: NOT DETECTED
MDMA (Ecstasy)Ur Screen: NOT DETECTED
METHADONE SCREEN, URINE: NOT DETECTED
OPIATE, UR SCREEN: NOT DETECTED
Phencyclidine (PCP) Ur S: NOT DETECTED
Tricyclic, Ur Screen: POSITIVE — AB

## 2017-01-19 LAB — CBC
HEMATOCRIT: 28.3 % — AB (ref 35.0–47.0)
Hemoglobin: 9.5 g/dL — ABNORMAL LOW (ref 12.0–16.0)
MCH: 31.9 pg (ref 26.0–34.0)
MCHC: 33.6 g/dL (ref 32.0–36.0)
MCV: 95.1 fL (ref 80.0–100.0)
Platelets: 245 10*3/uL (ref 150–440)
RBC: 2.98 MIL/uL — ABNORMAL LOW (ref 3.80–5.20)
RDW: 14.8 % — AB (ref 11.5–14.5)
WBC: 20 10*3/uL — AB (ref 3.6–11.0)

## 2017-01-19 LAB — BASIC METABOLIC PANEL
ANION GAP: 5 (ref 5–15)
BUN: 12 mg/dL (ref 6–20)
CO2: 24 mmol/L (ref 22–32)
CREATININE: 0.79 mg/dL (ref 0.44–1.00)
Calcium: 7.6 mg/dL — ABNORMAL LOW (ref 8.9–10.3)
Chloride: 108 mmol/L (ref 101–111)
GFR calc non Af Amer: >60 mL/min (ref >60)
Glucose, Bld: 142 mg/dL — ABNORMAL HIGH (ref 65–99)
Potassium: 3.4 mmol/L — ABNORMAL LOW (ref 3.5–5.1)
Sodium: 137 mmol/L (ref 135–145)

## 2017-01-19 LAB — HIV ANTIBODY (ROUTINE TESTING W REFLEX): HIV Screen 4th Generation wRfx: NONREACTIVE

## 2017-01-19 LAB — RPR: RPR: NONREACTIVE

## 2017-01-19 LAB — LACTIC ACID, PLASMA: LACTIC ACID, VENOUS: 0.7 mmol/L (ref 0.5–1.9)

## 2017-01-19 MED ORDER — QUETIAPINE FUMARATE 25 MG PO TABS
50.0000 mg | ORAL_TABLET | Freq: Every day | ORAL | Status: DC
  Administered 2017-01-20 – 2017-01-21 (×2): 50 mg via ORAL
  Filled 2017-01-19 (×2): qty 2

## 2017-01-19 MED ORDER — ACETAMINOPHEN 650 MG RE SUPP: 650.0000 mg | Freq: Four times a day (QID) | RECTAL | Status: DC | PRN

## 2017-01-19 MED ORDER — IBUPROFEN 400 MG PO TABS
400.0000 mg | ORAL_TABLET | Freq: Four times a day (QID) | ORAL | Status: DC | PRN
  Administered 2017-01-19: 400 mg via ORAL
  Filled 2017-01-19 (×2): qty 1

## 2017-01-19 MED ORDER — QUETIAPINE FUMARATE 200 MG PO TABS
200.0000 mg | ORAL_TABLET | Freq: Every day | ORAL | Status: DC
  Administered 2017-01-20: 200 mg via ORAL
  Filled 2017-01-19 (×2): qty 1

## 2017-01-19 MED ORDER — POTASSIUM CHLORIDE CRYS ER 20 MEQ PO TBCR
40.0000 meq | EXTENDED_RELEASE_TABLET | Freq: Once | ORAL | Status: AC
  Administered 2017-01-19: 40 meq via ORAL
  Filled 2017-01-19: qty 2

## 2017-01-19 MED ORDER — PHENOL 1.4 % MT LIQD
1.0000 | OROMUCOSAL | Status: DC | PRN
  Filled 2017-01-19: qty 177

## 2017-01-19 MED ORDER — ACETAMINOPHEN 325 MG PO TABS
650.0000 mg | ORAL_TABLET | Freq: Four times a day (QID) | ORAL | Status: DC | PRN
  Administered 2017-01-19: 650 mg via ORAL
  Filled 2017-01-19: qty 2

## 2017-01-19 NOTE — Progress Notes (Signed)
[employer] - [place] at [facility]   PATIENT NAME: Martha Proctor    MR#:  [id number]  DATE OF BIRTH:  08-05-1990  SUBJECTIVE:  CHIEF COMPLAINT:   Chief Complaint  Patient presents with  . Fever  . Abdominal Pain     Came with abdominal pain and fever and found to have bilateral pyelonephritis, admitted with sepsis.   Her white blood cell count is coming down, but she continued to have fever. Also had some hypotension.  REVIEW OF SYSTEMS:  CONSTITUTIONAL: positive for fever, fatigue or weakness.  EYES: No blurred or double vision.  EARS, NOSE, AND THROAT: No tinnitus or ear pain.  RESPIRATORY: No cough, shortness of breath, wheezing or hemoptysis.  CARDIOVASCULAR: No chest pain, orthopnea, edema.  GASTROINTESTINAL: No nausea, vomiting, diarrhea or abdominal pain.  GENITOURINARY: No dysuria, hematuria.  ENDOCRINE: No polyuria, nocturia,  HEMATOLOGY: No anemia, easy bruising or bleeding SKIN: No rash or lesion. MUSCULOSKELETAL: No joint pain or arthritis.   NEUROLOGIC: No tingling, numbness, weakness.  PSYCHIATRY: No anxiety or depression.   ROS  DRUG ALLERGIES:   Allergies  Allergen Reactions  . Toradol [Ketorolac Tromethamine] Anaphylaxis  . Tramadol Nausea And Vomiting    VITALS:  Blood pressure 101/67, pulse 91, temperature (!) 103 F (39.4 C), temperature source Oral, resp. rate (!) 25, height 5\' 4"  (1.626 m), weight 54.4 kg (120 lb), SpO2 93 %.  PHYSICAL EXAMINATION:  GENERAL:  26 y.o.-year-old patient lying in the bed with acute distress and fever.  EYES: Pupils equal, round, reactive to light and accommodation. No scleral icterus. Extraocular muscles intact.  HEENT: Head atraumatic, normocephalic. Oropharynx and nasopharynx clear. Tongue have a herpetic lesion  and very dry. NECK:  Supple, no jugular venous distention. No thyroid enlargement, no tenderness.  LUNGS: Normal breath sounds bilaterally, no wheezing, rales,rhonchi or crepitation.  No use of accessory muscles of respiration.  CARDIOVASCULAR: S1, S2 normal. No murmurs, rubs, or gallops.  ABDOMEN: Soft, nontender, nondistended. Bowel sounds present. No organomegaly or mass. Some tenderness on back. EXTREMITIES: No pedal edema, cyanosis, or clubbing.  NEUROLOGIC: Cranial nerves II through XII are intact. Muscle strength 5/5 in all extremities. Sensation intact. Gait not checked.  PSYCHIATRIC: The patient is alert and oriented x 3.  SKIN: No obvious rash, lesion, or ulcer.   Physical Exam LABORATORY PANEL:   CBC  Recent Labs Lab 01/19/17 0535  WBC 20.0*  HGB 9.5*  HCT 28.3*  PLT 245   ------------------------------------------------------------------------------------------------------------------  Chemistries   Recent Labs Lab 01/17/17 1731  01/19/17 0535  NA 132*  < > 137  K 3.3*  < > 3.4*  CL 101  < > 108  CO2 21*  < > 24  GLUCOSE 136*  < > 142*  BUN 38*  < > 12  CREATININE 1.16*  < > 0.79  CALCIUM 8.6*  < > 7.6*  MG 1.9  --   --   AST 21  --   --   ALT 14  --   --   ALKPHOS 82  --   --   BILITOT 0.4  --   --   < > = values in this interval not displayed. ------------------------------------------------------------------------------------------------------------------  Cardiac Enzymes  Recent Labs Lab 01/17/17 1731  TROPONINI <0.03   ------------------------------------------------------------------------------------------------------------------  RADIOLOGY:  Ct Abdomen Pelvis W Contrast  Result Date: 01/17/2017 CLINICAL DATA:  Fever and lower abdominal pain with spotting. Low back pain cramping. EXAM: CT ABDOMEN AND PELVIS WITH CONTRAST TECHNIQUE: Multidetector  CT imaging of the abdomen and pelvis was performed using the standard protocol following bolus administration of intravenous contrast. CONTRAST:  ISOVUE-300 IOPAMIDOL (ISOVUE-300) INJECTION 61% COMPARISON:  October 25, 2014 contrast-enhanced CT. FINDINGS: Lower chest: No acute  abnormality. Hepatobiliary: The liver and portal vein are normal. Gallbladder is normal in appearance but there is pericholecystic fluid. No wall thickening. Pancreas: Unremarkable. No pancreatic ductal dilatation or surrounding inflammatory changes. Spleen: Normal in size without focal abnormality. Adrenals/Urinary Tract: The adrenal glands are normal. Multiple regions of subtle decreased peripheral attenuation are seen in the kidneys on today's study but not the previous study. There is also minimal adjacent stranding posteriorly such as on image 32 on the left and image 41 on the right. There is the appearance of a subtle striated nephrogram on coronal images. No hydronephrosis on the right. The right ureter is mildly prominent but third of stones. No new hydronephrosis on the left. The left ureter is mildly prominent with no stones. The bladder is normal. Stomach/Bowel: The stomach and small bowel are normal. The colon is normal. The cecum lies within the right side of the pelvis. The appendix is difficult to see in its entirety but there is no secondary evidence of appendicitis. Vascular/Lymphatic: The aorta, iliac vessels, and femoral vessels are normal. There are a few shotty nodes in the retroperitoneum, left greater than right, likely reactive. Reproductive: Uterus and bilateral adnexa are unremarkable. Other: No abdominal wall hernia or abnormality. No abdominopelvic ascites. Musculoskeletal: No acute or significant osseous findings. IMPRESSION: 1. The kidneys are abnormal in appearance with bilateral striated nephrograms most consistent with bilateral pyelonephritis. Recommend clinical correlation and correlation with urinalysis. 2. The gallbladder is normal in appearance but there is pericholecystic fluid. If there is concern for acute cholecystitis, recommend an ultrasound. 3. No other acute abnormalities. Electronically Signed   By: Gerome Sam III M.D   On: 01/17/2017 20:40   Dg Chest Port 1  View  Result Date: 01/17/2017 CLINICAL DATA:  Fever and lower abdominal pain with spotting starting today. Low back is cramping. EXAM: PORTABLE CHEST 1 VIEW COMPARISON:  Chest x-ray dated 07/14/2016. FINDINGS: Cardiomediastinal silhouette is normal in size and configuration. Lungs are clear. Lung volumes are normal. No evidence of pneumonia. No pleural effusion. No pneumothorax. Osseous and soft tissue structures about the chest are unremarkable. IMPRESSION: Normal chest x-ray.  No evidence of pneumonia or pulmonary edema. Electronically Signed   By: Bary Richard M.D.   On: 01/17/2017 18:19    ASSESSMENT AND PLAN:   Active Problems:   Sepsis secondary to UTI [facility])  This is a 26 y.o. female with a history of hypothyroidism, generalized anxiety disorder, depression, polysubstance abuse now :  #. Sepsis secondary to bilateral pyelonephritis - IV antibiotics: Vanco, Zosyn - IV fluid hydration -Pain control and antiemetics as needed - Follow up blood,urine cultures - Repeat CBC in am.  - We will DC Vanc as no other source of infection then UTI. And ur cx shows E coli.  #. Hypokalemia and hyponatremia - Replace IV- improved. - recheck BMP in a.m.  #. Acute kidney injury likely secondary to #1 - IV fluids and repeat BMP in AM.  - Avoid nephrotoxic medications - Improved.  #. Active oral HSV lesions - Oral acyclovir per pharmacy  #. History ofanxiety and depression - Continue  Gabapentin, hydroxyzine, Seroquel, Elavil  #. History of  polysubstance abuse - Checked urine drug screen   All the records are reviewed and case discussed  with Care Management/Social Workerr. Management plans discussed with the patient, family and they are in agreement.  CODE STATUS: full  TOTAL TIME TAKING CARE OF THIS PATIENT: 35 minutes.   POSSIBLE D/C IN 1-2 DAYS, DEPENDING ON CLINICAL CONDITION.   Altamese Dilling M.D on 01/19/2017   Between 7am to 6pm - Pager - [phone]  After  6pm go to [email or web address] - password [email or web address]  [facility] [place] [facility]  Office  [phone]  CC: Primary care physician; Patient, No Pcp Per  Note: This dictation was prepared with Dragon dictation along with smaller phrase technology. Any transcriptional errors that result from this process are unintentional.

## 2017-01-19 NOTE — Progress Notes (Signed)
Pharmacy Antibiotic Note  Martha Proctor is a 26 y.o. female admitted on 01/17/2017 with Herpes orolabial (cold sores).  Pharmacy has been consulted for acyclovir dosing.  Plan: Patient is being started on acyclovir for oral herpes  Will continue acyclovir 200 mg PO 5 times daily for 5 days for oral herpes treatment.  Height: 5\' 4"  (162.6 cm) Weight: 120 lb (54.4 kg) IBW/kg (Calculated) : 54.7  Temp (24hrs), Avg:100.1 F (37.8 C), Min:98.3 F (36.8 C), Max:102.9 F (39.4 C)   Recent Labs Lab 01/17/17 1731 01/18/17 0016 01/18/17 0425 01/19/17 0535  WBC 27.8*  --  26.6* 20.0*  CREATININE 1.16*  --  0.97 0.79  LATICACIDVEN 1.2 2.2*  --   --     Estimated Creatinine Clearance: 93.1 mL/min (by C-G formula based on SCr of 0.79 mg/dL).    Allergies  Allergen Reactions  . Toradol [Ketorolac Tromethamine] Anaphylaxis  . Tramadol Nausea And Vomiting   Thank you for allowing pharmacy to be a part of this patient's care.  Crist FatHannah Edenilson Austad, PharmD, BCPS Clinical Pharmacist 01/19/2017 2:00 PM

## 2017-01-19 NOTE — Progress Notes (Signed)
CH made a follow up visit with pt. Pt's mother was at the bedside. CH spoke briefly with the pt and mother, but the mother decline CH's visit at this time.

## 2017-01-19 NOTE — Progress Notes (Signed)
Dr Hugelmeier notified pt lethargic, unable to take pm meds . Order given to hold.

## 2017-01-19 NOTE — Progress Notes (Addendum)
Md paged temp 102.9, acknowledged. No new orders given

## 2017-01-19 NOTE — Progress Notes (Signed)
Pharmacy Antibiotic Note  Martha Proctor is a 26 y.o. female admitted on 01/17/2017 with Possible urosepsis.  Pharmacy has been consulted for vanc/zosyn dosing. Vancomycin d/c'd.  Plan: Will continue Zosyn 3.375g IV q8h extended infusion. Please follow-up on culture/sensitivities to narrow therapy.  Height: 5\' 4"  (162.6 cm) Weight: 120 lb (54.4 kg) IBW/kg (Calculated) : 54.7  Temp (24hrs), Avg:100.1 F (37.8 C), Min:98.3 F (36.8 C), Max:102.9 F (39.4 C)   Recent Labs Lab 01/17/17 1731 01/18/17 0016 01/18/17 0425 01/19/17 0535  WBC 27.8*  --  26.6* 20.0*  CREATININE 1.16*  --  0.97 0.79  LATICACIDVEN 1.2 2.2*  --   --     Estimated Creatinine Clearance: 93.1 mL/min (by C-G formula based on SCr of 0.79 mg/dL).    Allergies  Allergen Reactions  . Toradol [Ketorolac Tromethamine] Anaphylaxis  . Tramadol Nausea And Vomiting   6/23 UCx >100k Ecoli - sens pending 6/23 BCx x2 NGTD  Vanc 6/23 >>6/24  Zosyn 6/23 >>  Thank you for allowing pharmacy to be a part of this patient's care.  Crist FatHannah Stephana Morell, PharmD, BCPS Clinical Pharmacist 01/19/2017 2:01 PM

## 2017-01-19 NOTE — Progress Notes (Signed)
PT Cancellation Note  Patient Details Name: Martha EstimableCiara Nicole Proctor MRN: [id number] DOB: 1990-10-25   Cancelled Treatment:    Reason Eval/Treat Not Completed: Patient declined, no reason specified: Consult received and chart reviewed. Currently unable to evaluate/treat, as pt declined due to feeling feverish. Mom present during attempt to treat; pt and mom both insistent on SPT return at later time. Will re-attempt, in the AM.    Latanya MaudlinLaura M Adelia Baptista 01/19/2017, 1:46 PM

## 2017-01-20 LAB — CBC
HEMATOCRIT: 26.4 % — AB (ref 35.0–47.0)
HEMOGLOBIN: 8.9 g/dL — AB (ref 12.0–16.0)
MCH: 32.2 pg (ref 26.0–34.0)
MCHC: 33.6 g/dL (ref 32.0–36.0)
MCV: 95.6 fL (ref 80.0–100.0)
Platelets: 238 10*3/uL (ref 150–440)
RBC: 2.76 MIL/uL — AB (ref 3.80–5.20)
RDW: 15 % — ABNORMAL HIGH (ref 11.5–14.5)
WBC: 6.2 10*3/uL (ref 3.6–11.0)

## 2017-01-20 LAB — URINE CULTURE: Culture: >=100000 — AB

## 2017-01-20 MED ORDER — MORPHINE SULFATE (PF) 2 MG/ML IV SOLN
1.0000 mg | INTRAVENOUS | Status: DC | PRN
  Administered 2017-01-20 – 2017-01-21 (×2): 1 mg via INTRAVENOUS
  Filled 2017-01-20 (×2): qty 1

## 2017-01-20 MED ORDER — SODIUM CHLORIDE 0.9 % IV SOLN
INTRAVENOUS | Status: DC
  Administered 2017-01-20 – 2017-01-21 (×2): via INTRAVENOUS

## 2017-01-20 MED ORDER — LIDOCAINE VISCOUS 2 % MT SOLN
15.0000 mL | OROMUCOSAL | Status: DC | PRN
  Filled 2017-01-20 (×2): qty 15

## 2017-01-20 MED ORDER — DEXTROSE 5 % IV SOLN
2.0000 g | INTRAVENOUS | Status: DC
  Administered 2017-01-20: 2 g via INTRAVENOUS
  Filled 2017-01-20 (×2): qty 2

## 2017-01-20 NOTE — Progress Notes (Signed)
[facility] [place] at [facility]   PATIENT NAME: Martha Proctor    MR#:  [id number]  DATE OF BIRTH:  1990/11/21  SUBJECTIVE:  CHIEF COMPLAINT:   Chief Complaint  Patient presents with  . Fever  . Abdominal Pain     Came with abdominal pain and fever and found to have bilateral pyelonephritis, admitted with sepsis.   Her white blood cell count is coming down, . Also had some hypotension. Last fever was yesterday afternoon. C/o pain in mouth.  REVIEW OF SYSTEMS:  CONSTITUTIONAL: positive for fever, fatigue or weakness.  EYES: No blurred or double vision.  EARS, NOSE, AND THROAT: No tinnitus or ear pain.  RESPIRATORY: No cough, shortness of breath, wheezing or hemoptysis.  CARDIOVASCULAR: No chest pain, orthopnea, edema.  GASTROINTESTINAL: No nausea, vomiting, diarrhea or abdominal pain.  GENITOURINARY: No dysuria, hematuria.  ENDOCRINE: No polyuria, nocturia,  HEMATOLOGY: No anemia, easy bruising or bleeding SKIN: No rash or lesion. MUSCULOSKELETAL: No joint pain or arthritis.   NEUROLOGIC: No tingling, numbness, weakness.  PSYCHIATRY: No anxiety or depression.   ROS  DRUG ALLERGIES:   Allergies  Allergen Reactions  . Toradol [Ketorolac Tromethamine] Anaphylaxis  . Tramadol Nausea And Vomiting    VITALS:  Blood pressure 114/74, pulse (!) 103, temperature 99.4 F (37.4 C), temperature source Oral, resp. rate 16, height 5\' 4"  (1.626 m), weight 54.4 kg (120 lb), SpO2 93 %.  PHYSICAL EXAMINATION:  GENERAL:  26 y.o.-year-old patient lying in the bed with acute distress and fever.  EYES: Pupils equal, round, reactive to light and accommodation. No scleral icterus. Extraocular muscles intact.  HEENT: Head atraumatic, normocephalic. Oropharynx and nasopharynx clear. Tongue have a herpetic lesion  and very dry and red. NECK:  Supple, no jugular venous distention. No thyroid enlargement, no tenderness.  LUNGS: Normal breath sounds bilaterally, no wheezing,  rales,rhonchi or crepitation. No use of accessory muscles of respiration.  CARDIOVASCULAR: S1, S2 normal. No murmurs, rubs, or gallops.  ABDOMEN: Soft, nontender, nondistended. Bowel sounds present. No organomegaly or mass. Some tenderness on back. EXTREMITIES: No pedal edema, cyanosis, or clubbing.  NEUROLOGIC: Cranial nerves II through XII are intact. Muscle strength 5/5 in all extremities. Sensation intact. Gait not checked.  PSYCHIATRIC: The patient is alert and oriented x 3.  SKIN: No obvious rash, lesion, or ulcer.   Physical Exam LABORATORY PANEL:   CBC  Recent Labs Lab 01/20/17 0427  WBC 6.2  HGB 8.9*  HCT 26.4*  PLT 238   ------------------------------------------------------------------------------------------------------------------  Chemistries   Recent Labs Lab 01/17/17 1731  01/19/17 0535  NA 132*  < > 137  K 3.3*  < > 3.4*  CL 101  < > 108  CO2 21*  < > 24  GLUCOSE 136*  < > 142*  BUN 38*  < > 12  CREATININE 1.16*  < > 0.79  CALCIUM 8.6*  < > 7.6*  MG 1.9  --   --   AST 21  --   --   ALT 14  --   --   ALKPHOS 82  --   --   BILITOT 0.4  --   --   < > = values in this interval not displayed. ------------------------------------------------------------------------------------------------------------------  Cardiac Enzymes  Recent Labs Lab 01/17/17 1731  TROPONINI <0.03   ------------------------------------------------------------------------------------------------------------------  RADIOLOGY:  No results found.  ASSESSMENT AND PLAN:   Active Problems:   Sepsis secondary to UTI [facility])  This is a 26 y.o. female with a  history of hypothyroidism, generalized anxiety disorder, depression, polysubstance abuse now :  #. Sepsis secondary to bilateral pyelonephritis - IV antibiotics: Vanco, Zosyn - IV fluid hydration -Pain control and antiemetics as needed - Follow up blood,urine cultures - Repeat CBC in am.  - We will DC Vanc as no other  source of infection then UTI. And ur cx shows E coli. - Urine culture reported pansensitive Escherichia coli so I will change to IV ceftriaxone.  #. Hypokalemia and hyponatremia - Replace IV- improved.  #. Acute kidney injury likely secondary to #1 - IV fluids and repeat BMP in AM.  - Avoid nephrotoxic medications - Improved.  #. Active oral HSV lesions - Oral acyclovir per pharmacy - Still complaining of pain in mouth on swellowing,  Added lidocaine oral liquid.  #. History ofanxiety and depression - Continue  Gabapentin, hydroxyzine, Seroquel, Elavil  #. History of  polysubstance abuse - Checked urine drug screen   All the records are reviewed and case discussed with Care Management/Social Workerr. Management plans discussed with the patient, family and they are in agreement.  CODE STATUS: full  TOTAL TIME TAKING CARE OF THIS PATIENT: 35 minutes.   POSSIBLE D/C IN 1-2 DAYS, DEPENDING ON CLINICAL CONDITION.   Altamese DillingVACHHANI, Beronica Lansdale M.D on 01/20/2017   Between 7am to 6pm - Pager - [phone]  After 6pm go to [email or web address] - password [email or web address]  [facility] [place] [facility]  Office  [phone]  CC: Primary care physician; Patient, No Pcp Per  Note: This dictation was prepared with Dragon dictation along with smaller phrase technology. Any transcriptional errors that result from this process are unintentional.

## 2017-01-20 NOTE — Progress Notes (Signed)
PT Cancellation Note  Patient Details Name: Skip EstimableCiara Nicole Pak MRN: [id number] DOB: 06-13-1991   Cancelled Treatment:    Reason Eval/Treat Not Completed: Other (comment) D/c in-house. SPT performed 2 min screen as pt was uninterested in participation/communication. Pt jumped out of bed and walked to door and back with IV pole, independently with reciprocal gait and good gait speed; no concerns for safety or LOB noted. Unable to obtain history as pt was disinterested in communicating with SPT. SPT prompted pt to put shoes or grippy socks on prior to amb, and pt refused, saying "It's fine," and jumping out of bed. SPT asked pt if she felt safe with amb, pt stated, "yep, my feet are just fat," and rolled over in fetal position (with back to SPT) to go to sleep. SPT encouraged pt to call nursing if she has any questions or needs. Plan to complete orders.   Latanya MaudlinLaura M Aislee Landgren 01/20/2017, 11:54 AM

## 2017-01-21 MED ORDER — CEPHALEXIN 500 MG PO CAPS: 500.0000 mg | ORAL_CAPSULE | Freq: Two times a day (BID) | ORAL | 0 refills | Status: AC

## 2017-01-21 MED ORDER — ACYCLOVIR 200 MG PO CAPS: 200.0000 mg | ORAL_CAPSULE | Freq: Every day | ORAL | 0 refills | Status: AC

## 2017-01-21 MED ORDER — GABAPENTIN 600 MG PO TABS: 600.0000 mg | ORAL_TABLET | Freq: Three times a day (TID) | ORAL | 0 refills | Status: AC

## 2017-01-21 MED ORDER — METRONIDAZOLE 500 MG PO TABS
2000.0000 mg | ORAL_TABLET | Freq: Once | ORAL | Status: AC
  Administered 2017-01-21: 2000 mg via ORAL
  Filled 2017-01-21 (×2): qty 4

## 2017-01-21 MED ORDER — CEPHALEXIN 500 MG PO CAPS: 500.0000 mg | ORAL_CAPSULE | Freq: Two times a day (BID) | ORAL | Status: DC

## 2017-01-21 NOTE — Discharge Instructions (Signed)
Pt to get PCP in the area.  Pt will f/u with [facility] in the area from the contact numbers she has been given

## 2017-01-21 NOTE — Progress Notes (Signed)
LCSW called to complete note for patient:  Court missed.  Note completed per request. [place].  No other needs.  Deretha EmoryHannah Mayvis Agudelo LCSW, MSW Clinical Social Work: Optician, dispensingystem [employer] Coverage for :  [phone]
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
## 2017-01-21 NOTE — Progress Notes (Signed)
Pharmacy Antibiotic Note  Martha Proctor is a 26 y.o. female admitted on 01/17/2017 with Herpes orolabial (cold sores).  Pharmacy has been consulted for acyclovir dosing.  Plan: Patient is being started on acyclovir for oral herpes  Will continue acyclovir 200 mg PO 5 times daily for 5 days for oral herpes treatment.  Height: 5\' 4"  (162.6 cm) Weight: 120 lb (54.4 kg) IBW/kg (Calculated) : 54.7  Temp (24hrs), Avg:99.7 F (37.6 C), Min:99.3 F (37.4 C), Max:100 F (37.8 C)   Recent Labs Lab 01/17/17 1731 01/18/17 0016 01/18/17 0425 01/19/17 0535 01/19/17 1323 01/20/17 0427  WBC 27.8*  --  26.6* 20.0*  --  6.2  CREATININE 1.16*  --  0.97 0.79  --   --   LATICACIDVEN 1.2 2.2*  --   --  0.7  --     Estimated Creatinine Clearance: 93.1 mL/min (by C-G formula based on SCr of 0.79 mg/dL).    Allergies  Allergen Reactions  . Toradol [Ketorolac Tromethamine] Anaphylaxis  . Tramadol Nausea And Vomiting   Thank you for allowing pharmacy to be a part of this patient's care.  Olene FlossMelissa D Breckin Zafar, Pharm.D, BCPS Clinical Pharmacist  01/21/2017 8:19 AM

## 2017-01-21 NOTE — Progress Notes (Signed)
Pt A and O x 4. VSS. Pt tolerating diet well. Minimal complaints of pain with no nausea. Oral lidocaine offered multiple times with pt refusal. MD encourage pt to try this as well and pt refused and cursed at MD. IV removed intact, prescriptions given. Pt and mother voiced understanding of discharge instructions with no further questions. Follow-up appointment setup for pt. Wheelchair was offered and pt refused and ambulated down independently

## 2017-01-22 LAB — CULTURE, BLOOD (ROUTINE X 2)
CULTURE: NO GROWTH
Culture: NO GROWTH
Special Requests: ADEQUATE

## 2017-01-24 LAB — CULTURE, BLOOD (ROUTINE X 2)
CULTURE: NO GROWTH
Culture: NO GROWTH
Special Requests: ADEQUATE

## 2017-05-16 ENCOUNTER — Emergency Department
Admission: EM | Admit: 2017-05-16 | Discharge: 2017-05-16 | Disposition: A | Discharge status: Home / Self Care | Payer: BLUE CROSS/BLUE SHIELD | Attending: Emergency Medicine | Admitting: Emergency Medicine

## 2017-05-16 DIAGNOSIS — E031 Congenital hypothyroidism without goiter: Secondary | ICD-10-CM | POA: Diagnosis not present

## 2017-05-16 DIAGNOSIS — F17210 Nicotine dependence, cigarettes, uncomplicated: Secondary | ICD-10-CM | POA: Diagnosis not present

## 2017-05-16 DIAGNOSIS — Z3202 Encounter for pregnancy test, result negative: Secondary | ICD-10-CM | POA: Insufficient documentation

## 2017-05-16 DIAGNOSIS — Z79899 Other long term (current) drug therapy: Secondary | ICD-10-CM | POA: Diagnosis not present

## 2017-05-16 DIAGNOSIS — Z139 Encounter for screening, unspecified: Secondary | ICD-10-CM

## 2017-05-16 LAB — CBC
HEMATOCRIT: 36.8 % (ref 35.0–47.0)
Hemoglobin: 12.6 g/dL (ref 12.0–16.0)
MCH: 31.5 pg (ref 26.0–34.0)
MCHC: 34.2 g/dL (ref 32.0–36.0)
MCV: 92.2 fL (ref 80.0–100.0)
PLATELETS: 335 10*3/uL (ref 150–440)
RBC: 3.99 MIL/uL (ref 3.80–5.20)
RDW: 15.1 % — AB (ref 11.5–14.5)
WBC: 12.1 10*3/uL — AB (ref 3.6–11.0)

## 2017-05-16 LAB — COMPREHENSIVE METABOLIC PANEL
ALT: 12 U/L — ABNORMAL LOW (ref 14–54)
ANION GAP: 7 (ref 5–15)
AST: 25 U/L (ref 15–41)
Albumin: 4.1 g/dL (ref 3.5–5.0)
Alkaline Phosphatase: 52 U/L (ref 38–126)
BILIRUBIN TOTAL: 0.6 mg/dL (ref 0.3–1.2)
BUN: 22 mg/dL — ABNORMAL HIGH (ref 6–20)
CHLORIDE: 99 mmol/L — AB (ref 101–111)
CO2: 27 mmol/L (ref 22–32)
Calcium: 8.9 mg/dL (ref 8.9–10.3)
Creatinine, Ser: 1.14 mg/dL — ABNORMAL HIGH (ref 0.44–1.00)
GLUCOSE: 82 mg/dL (ref 65–99)
POTASSIUM: 3.6 mmol/L (ref 3.5–5.1)
Sodium: 133 mmol/L — ABNORMAL LOW (ref 135–145)
Total Protein: 9.2 g/dL — ABNORMAL HIGH (ref 6.5–8.1)

## 2017-05-16 LAB — POCT PREGNANCY, URINE: Preg Test, Ur: NEGATIVE

## 2017-05-16 LAB — HCG, QUANTITATIVE, PREGNANCY: hCG, Beta Chain, Quant, S: <1 m[IU]/mL (ref <5)

## 2017-05-16 NOTE — ED Notes (Signed)
Pt left telling secretary she didn't need paperwork. Unable to reassess vital signs.
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
## 2017-05-16 NOTE — ED Notes (Signed)
POC urine negative. Per pt, urine tests are not accurate for her due to thyroid issue. Blood sent down to lab for hcg.

## 2017-05-16 NOTE — Discharge Instructions (Signed)
Follow-up with your regular doctor. You are NOT pregnant today.  Please return to the emergency room right away if you are to develop a fever, severe nausea, swelling, you develop pain, burning or trouble with urination, you are unable to keep food down, begin vomiting any dark or bloody fluid, you develop any dark or bloody stools, feel dehydrated, or other new concerns or symptoms arise.

## 2017-05-16 NOTE — ED Notes (Signed)
States belly button has been "sticking out" x a few weeks. States feels "movement in her stomach x 1 week. Pt abd is rounded, both urine and blood pregnancy tests are negative. Ultrasound at bedside negative

## 2017-05-16 NOTE — ED Triage Notes (Signed)
Patient to stat desk in no distress at this time. Patient asking about wait time. Patient given up date on wait time.

## 2017-05-16 NOTE — ED Triage Notes (Addendum)
Pt came to ED via pov, reporting pregnancy. Has not taken a test but reports can feel movement and wants to be evaluated. This would be pts 3rd pregnancy. Denies any n/v or abdominal pain. Pt reports does not get periods due to thyroid problems.

## 2017-05-21 ENCOUNTER — Encounter: Payer: Self-pay | Admitting: Emergency Medicine

## 2017-05-21 ENCOUNTER — Emergency Department
Admission: EM | Admit: 2017-05-21 | Discharge: 2017-05-21 | Disposition: A | Discharge status: Home / Self Care | Payer: BLUE CROSS/BLUE SHIELD | Attending: Emergency Medicine | Admitting: Emergency Medicine

## 2017-05-21 DIAGNOSIS — Z79899 Other long term (current) drug therapy: Secondary | ICD-10-CM | POA: Diagnosis not present

## 2017-05-21 DIAGNOSIS — F17210 Nicotine dependence, cigarettes, uncomplicated: Secondary | ICD-10-CM | POA: Diagnosis not present

## 2017-05-21 DIAGNOSIS — E039 Hypothyroidism, unspecified: Secondary | ICD-10-CM | POA: Insufficient documentation

## 2017-05-21 DIAGNOSIS — R253 Fasciculation: Secondary | ICD-10-CM

## 2017-05-21 LAB — COMPREHENSIVE METABOLIC PANEL
ALK PHOS: 56 U/L (ref 38–126)
ALT: 14 U/L (ref 14–54)
AST: 26 U/L (ref 15–41)
Albumin: 4.2 g/dL (ref 3.5–5.0)
Anion gap: 10 (ref 5–15)
BUN: 19 mg/dL (ref 6–20)
CHLORIDE: 99 mmol/L — AB (ref 101–111)
CO2: 26 mmol/L (ref 22–32)
CREATININE: 1.11 mg/dL — AB (ref 0.44–1.00)
Calcium: 8.7 mg/dL — ABNORMAL LOW (ref 8.9–10.3)
GFR calc Af Amer: >60 mL/min (ref >60)
Glucose, Bld: 115 mg/dL — ABNORMAL HIGH (ref 65–99)
Potassium: 3.5 mmol/L (ref 3.5–5.1)
SODIUM: 135 mmol/L (ref 135–145)
Total Bilirubin: 0.2 mg/dL — ABNORMAL LOW (ref 0.3–1.2)
Total Protein: 9.4 g/dL — ABNORMAL HIGH (ref 6.5–8.1)

## 2017-05-21 LAB — POCT PREGNANCY, URINE: Preg Test, Ur: NEGATIVE

## 2017-05-21 LAB — URINALYSIS, COMPLETE (UACMP) WITH MICROSCOPIC
BACTERIA UA: NONE SEEN
Bilirubin Urine: NEGATIVE
Glucose, UA: NEGATIVE mg/dL
Hgb urine dipstick: NEGATIVE
KETONES UR: NEGATIVE mg/dL
Leukocytes, UA: NEGATIVE
Nitrite: NEGATIVE
PH: 8 (ref 5.0–8.0)
Protein, ur: NEGATIVE mg/dL
SPECIFIC GRAVITY, URINE: 1.008 (ref 1.005–1.030)

## 2017-05-21 LAB — CBC
HCT: 36.3 % (ref 35.0–47.0)
Hemoglobin: 12.1 g/dL (ref 12.0–16.0)
MCH: 30.9 pg (ref 26.0–34.0)
MCHC: 33.4 g/dL (ref 32.0–36.0)
MCV: 92.7 fL (ref 80.0–100.0)
PLATELETS: 363 10*3/uL (ref 150–440)
RBC: 3.92 MIL/uL (ref 3.80–5.20)
RDW: 15 % — AB (ref 11.5–14.5)
WBC: 10.7 10*3/uL (ref 3.6–11.0)

## 2017-05-21 LAB — LITHIUM LEVEL: Lithium Lvl: 0.86 mmol/L (ref 0.60–1.20)

## 2017-05-21 MED ORDER — LORAZEPAM 1 MG PO TABS
1.0000 mg | ORAL_TABLET | Freq: Once | ORAL | Status: AC
  Administered 2017-05-21: 1 mg via ORAL
  Filled 2017-05-21: qty 1

## 2017-05-21 NOTE — ED Triage Notes (Signed)
Pt comes into the ED via [facility] c/o new onset of involuntary twitching and swelling to her abdomen.  Patient is having spasms while sitting in the triage room.  Patient is currently on lithium, Seroquel, and gabapentin.  Recently released from [facility] to detox off of meth, marijuana, and benzo's.  Patient states she has had no substance abuse since leaving the facility.  Patient is cooperative in triage.

## 2017-05-21 NOTE — ED Notes (Signed)
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
## 2017-05-21 NOTE — ED Notes (Signed)
Pt states that she is ready to go home, "I feel fine"; pt informed that MD will be notified, and he will come and speak to them if he is not ready to discharge her. MD notified.

## 2017-05-21 NOTE — Discharge Instructions (Signed)
Please seek medical attention for any high fevers, chest pain, shortness of breath, change in behavior, persistent vomiting, bloody stool or any other new or concerning symptoms.  
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
## 2017-06-19 ENCOUNTER — Ambulatory Visit
Admission: EM | Admit: 2017-06-19 | Discharge: 2017-06-19 | Discharge status: Absent without leave | Payer: BLUE CROSS/BLUE SHIELD

## 2017-07-12 MED ORDER — HYDROXYZINE HCL 25 MG PO TABS
50.00 mg | ORAL_TABLET | ORAL | Status: DC
Start: ? — End: 2017-07-12

## 2017-07-12 MED ORDER — QUETIAPINE FUMARATE 200 MG PO TABS
200.00 mg | ORAL_TABLET | ORAL | Status: DC
Start: 2017-07-12 — End: 2017-07-12

## 2017-07-12 MED ORDER — CEFDINIR 300 MG PO CAPS
300.00 mg | ORAL_CAPSULE | ORAL | Status: DC
Start: 2017-07-12 — End: 2017-07-12

## 2017-07-12 MED ORDER — ONDANSETRON 4 MG PO TBDP
4.00 mg | ORAL_TABLET | ORAL | Status: DC
Start: ? — End: 2017-07-12

## 2017-07-12 MED ORDER — PREGABALIN 100 MG PO CAPS
100.00 mg | ORAL_CAPSULE | ORAL | Status: DC
Start: 2017-07-12 — End: 2017-07-12

## 2017-07-12 MED ORDER — PROMETHAZINE HCL 25 MG/ML IJ SOLN
6.25 mg | INTRAMUSCULAR | Status: DC
Start: ? — End: 2017-07-12

## 2017-07-12 MED ORDER — DOXYCYCLINE HYCLATE 100 MG PO TABS
100.00 mg | ORAL_TABLET | ORAL | Status: DC
Start: 2017-07-12 — End: 2017-07-12

## 2017-07-12 MED ORDER — GENERIC EXTERNAL MEDICATION
Status: DC
Start: ? — End: 2017-07-12

## 2017-07-12 MED ORDER — MEPERIDINE HCL 25 MG/ML IJ SOLN
12.50 mg | INTRAMUSCULAR | Status: DC
Start: ? — End: 2017-07-12

## 2017-07-12 MED ORDER — ACETAMINOPHEN 325 MG PO TABS
650.00 mg | ORAL_TABLET | ORAL | Status: DC
Start: ? — End: 2017-07-12

## 2017-07-12 MED ORDER — ACETAMINOPHEN 325 MG PO TABS
650.00 mg | ORAL_TABLET | ORAL | Status: DC
Start: 2017-07-12 — End: 2017-07-12

## 2017-07-12 MED ORDER — LITHIUM CARBONATE 300 MG PO CAPS
300.00 mg | ORAL_CAPSULE | ORAL | Status: DC
Start: 2017-07-12 — End: 2017-07-12

## 2017-07-12 MED ORDER — QUETIAPINE FUMARATE 25 MG PO TABS
50.00 mg | ORAL_TABLET | ORAL | Status: DC
Start: 2017-07-13 — End: 2017-07-12

## 2017-07-12 MED ORDER — METRONIDAZOLE 500 MG PO TABS
500.00 mg | ORAL_TABLET | ORAL | Status: DC
Start: 2017-07-12 — End: 2017-07-12

## 2017-07-12 MED ORDER — NICOTINE 21 MG/24HR TD PT24
1.00 | MEDICATED_PATCH | TRANSDERMAL | Status: DC
Start: 2017-07-13 — End: 2017-07-12

## 2017-07-12 MED ORDER — LEVOTHYROXINE SODIUM 100 MCG PO TABS
200.00 | ORAL_TABLET | ORAL | Status: DC
Start: 2017-07-13 — End: 2017-07-12

## 2017-07-12 MED ORDER — NALOXONE HCL 4 MG/10ML IJ SOLN
0.40 mg | INTRAMUSCULAR | Status: DC
Start: ? — End: 2017-07-12

## 2017-07-12 MED ORDER — LORAZEPAM 0.5 MG PO TABS
0.50 mg | ORAL_TABLET | ORAL | Status: DC
Start: ? — End: 2017-07-12
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
## 2017-07-24 ENCOUNTER — Inpatient Hospital Stay: Payer: BLUE CROSS/BLUE SHIELD | Attending: Oncology | Admitting: Oncology

## 2017-11-24 ENCOUNTER — Observation Stay
Admission: EM | Admit: 2017-11-24 | Discharge: 2017-11-26 | Disposition: A | Discharge status: Home / Self Care | Payer: BLUE CROSS/BLUE SHIELD | Attending: Internal Medicine | Admitting: Internal Medicine

## 2017-11-24 ENCOUNTER — Other Ambulatory Visit: Payer: Self-pay

## 2017-11-24 DIAGNOSIS — F410 Panic disorder [episodic paroxysmal anxiety] without agoraphobia: Secondary | ICD-10-CM | POA: Insufficient documentation

## 2017-11-24 DIAGNOSIS — F411 Generalized anxiety disorder: Secondary | ICD-10-CM | POA: Diagnosis not present

## 2017-11-24 DIAGNOSIS — Y92480 Sidewalk as the place of occurrence of the external cause: Secondary | ICD-10-CM | POA: Insufficient documentation

## 2017-11-24 DIAGNOSIS — F329 Major depressive disorder, single episode, unspecified: Secondary | ICD-10-CM | POA: Diagnosis not present

## 2017-11-24 DIAGNOSIS — T63061A Toxic effect of venom of other North and South American snake, accidental (unintentional), initial encounter: Principal | ICD-10-CM | POA: Insufficient documentation

## 2017-11-24 DIAGNOSIS — Z79899 Other long term (current) drug therapy: Secondary | ICD-10-CM | POA: Diagnosis not present

## 2017-11-24 DIAGNOSIS — Z888 Allergy status to other drugs, medicaments and biological substances status: Secondary | ICD-10-CM | POA: Diagnosis not present

## 2017-11-24 DIAGNOSIS — N182 Chronic kidney disease, stage 2 (mild): Secondary | ICD-10-CM | POA: Insufficient documentation

## 2017-11-24 DIAGNOSIS — I959 Hypotension, unspecified: Secondary | ICD-10-CM | POA: Diagnosis not present

## 2017-11-24 DIAGNOSIS — F17210 Nicotine dependence, cigarettes, uncomplicated: Secondary | ICD-10-CM | POA: Insufficient documentation

## 2017-11-24 DIAGNOSIS — E031 Congenital hypothyroidism without goiter: Secondary | ICD-10-CM | POA: Diagnosis not present

## 2017-11-24 DIAGNOSIS — Y9301 Activity, walking, marching and hiking: Secondary | ICD-10-CM | POA: Insufficient documentation

## 2017-11-24 DIAGNOSIS — W5911XA Bitten by nonvenomous snake, initial encounter: Secondary | ICD-10-CM | POA: Diagnosis present

## 2017-11-24 DIAGNOSIS — T63001A Toxic effect of unspecified snake venom, accidental (unintentional), initial encounter: Secondary | ICD-10-CM | POA: Diagnosis present

## 2017-11-24 DIAGNOSIS — N179 Acute kidney failure, unspecified: Secondary | ICD-10-CM | POA: Diagnosis not present

## 2017-11-24 DIAGNOSIS — E876 Hypokalemia: Secondary | ICD-10-CM | POA: Diagnosis not present

## 2017-11-24 DIAGNOSIS — F419 Anxiety disorder, unspecified: Secondary | ICD-10-CM

## 2017-11-24 HISTORY — DX: Unspecified kidney failure: N19

## 2017-11-24 LAB — BASIC METABOLIC PANEL
Anion gap: 7 (ref 5–15)
BUN: 21 mg/dL — AB (ref 6–20)
CHLORIDE: 102 mmol/L (ref 101–111)
CO2: 30 mmol/L (ref 22–32)
CREATININE: 1.05 mg/dL — AB (ref 0.44–1.00)
Calcium: 9 mg/dL (ref 8.9–10.3)
GFR calc Af Amer: >60 mL/min (ref >60)
GFR calc non Af Amer: >60 mL/min (ref >60)
Glucose, Bld: 78 mg/dL (ref 65–99)
Potassium: 3.8 mmol/L (ref 3.5–5.1)
SODIUM: 139 mmol/L (ref 135–145)

## 2017-11-24 LAB — CBC
HEMATOCRIT: 42 % (ref 35.0–47.0)
HEMOGLOBIN: 14.2 g/dL (ref 12.0–16.0)
MCH: 31.6 pg (ref 26.0–34.0)
MCHC: 33.9 g/dL (ref 32.0–36.0)
MCV: 93.3 fL (ref 80.0–100.0)
Platelets: 247 10*3/uL (ref 150–440)
RBC: 4.5 MIL/uL (ref 3.80–5.20)
RDW: 16.5 % — ABNORMAL HIGH (ref 11.5–14.5)
WBC: 7.9 10*3/uL (ref 3.6–11.0)

## 2017-11-24 LAB — HCG, QUANTITATIVE, PREGNANCY

## 2017-11-24 LAB — PROTIME-INR
INR: 1
Prothrombin Time: 13.1 seconds (ref 11.4–15.2)

## 2017-11-24 LAB — FIBRINOGEN: Fibrinogen: 317 mg/dL (ref 210–475)

## 2017-11-24 MED ORDER — SODIUM CHLORIDE 0.9 % IV SOLN
4.0000 | Freq: Once | INTRAVENOUS | Status: AC
  Administered 2017-11-24: 72 mL via INTRAVENOUS
  Filled 2017-11-24: qty 72

## 2017-11-24 MED ORDER — LORAZEPAM 2 MG/ML IJ SOLN
0.5000 mg | Freq: Once | INTRAMUSCULAR | Status: AC
  Administered 2017-11-24: 0.5 mg via INTRAVENOUS
  Filled 2017-11-24: qty 1

## 2017-11-24 MED ORDER — FENTANYL CITRATE (PF) 100 MCG/2ML IJ SOLN
100.0000 ug | Freq: Once | INTRAMUSCULAR | Status: AC
  Administered 2017-11-24: 100 ug via INTRAVENOUS

## 2017-11-24 MED ORDER — TETANUS-DIPHTH-ACELL PERTUSSIS 5-2.5-18.5 LF-MCG/0.5 IM SUSP
0.5000 mL | Freq: Once | INTRAMUSCULAR | Status: AC
  Administered 2017-11-24: 0.5 mL via INTRAMUSCULAR
  Filled 2017-11-24: qty 0.5

## 2017-11-24 MED ORDER — FENTANYL CITRATE (PF) 100 MCG/2ML IJ SOLN
INTRAMUSCULAR | Status: AC
  Filled 2017-11-24: qty 2

## 2017-11-24 MED ORDER — SODIUM CHLORIDE 0.9 % IV BOLUS
1000.0000 mL | Freq: Once | INTRAVENOUS | Status: AC
  Administered 2017-11-24: 1000 mL via INTRAVENOUS

## 2017-11-24 MED ORDER — FENTANYL CITRATE (PF) 100 MCG/2ML IJ SOLN
50.0000 ug | Freq: Once | INTRAMUSCULAR | Status: AC
  Administered 2017-11-24: 50 ug via INTRAVENOUS
  Filled 2017-11-24: qty 2
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
## 2017-11-24 NOTE — ED Triage Notes (Signed)
Pt arrives to ED via [facility] from home where she was on sidewalk and has a single puncture wound from copperhead, medium sized snake. EMS reports they did kill snake and confirmed it was a copperhead. Happened about 30 minutes PTA. Red and swollen L foot upon arrival. Alert, oriented.

## 2017-11-25 ENCOUNTER — Other Ambulatory Visit: Payer: Self-pay

## 2017-11-25 LAB — BASIC METABOLIC PANEL
ANION GAP: 6 (ref 5–15)
BUN: 19 mg/dL (ref 6–20)
CO2: 24 mmol/L (ref 22–32)
CREATININE: 0.74 mg/dL (ref 0.44–1.00)
Calcium: 7.6 mg/dL — ABNORMAL LOW (ref 8.9–10.3)
Chloride: 106 mmol/L (ref 101–111)
GFR calc Af Amer: >60 mL/min (ref >60)
GLUCOSE: 120 mg/dL — AB (ref 65–99)
Potassium: 3.1 mmol/L — ABNORMAL LOW (ref 3.5–5.1)
Sodium: 136 mmol/L (ref 135–145)

## 2017-11-25 LAB — CBC
HCT: 36.1 % (ref 35.0–47.0)
Hemoglobin: 12.2 g/dL (ref 12.0–16.0)
MCH: 32.4 pg (ref 26.0–34.0)
MCHC: 33.9 g/dL (ref 32.0–36.0)
MCV: 95.6 fL (ref 80.0–100.0)
Platelets: 200 10*3/uL (ref 150–440)
RBC: 3.78 MIL/uL — ABNORMAL LOW (ref 3.80–5.20)
RDW: 16.9 % — AB (ref 11.5–14.5)
WBC: 7.9 10*3/uL (ref 3.6–11.0)

## 2017-11-25 LAB — GLUCOSE, CAPILLARY: Glucose-Capillary: 106 mg/dL — ABNORMAL HIGH (ref 65–99)

## 2017-11-25 MED ORDER — HYDROMORPHONE HCL 1 MG/ML IJ SOLN
0.5000 mg | INTRAMUSCULAR | Status: DC | PRN
Start: 2017-11-25 — End: 2017-11-26
  Administered 2017-11-25 – 2017-11-26 (×3): 0.5 mg via INTRAVENOUS
  Filled 2017-11-25 (×4): qty 0.5

## 2017-11-25 MED ORDER — ONDANSETRON HCL 4 MG PO TABS: 4.0000 mg | ORAL_TABLET | Freq: Four times a day (QID) | ORAL | Status: DC | PRN

## 2017-11-25 MED ORDER — LEVOTHYROXINE SODIUM 50 MCG PO TABS
250.0000 ug | ORAL_TABLET | Freq: Every day | ORAL | Status: DC
  Administered 2017-11-25 – 2017-11-26 (×2): 250 ug via ORAL
  Filled 2017-11-25 (×2): qty 2

## 2017-11-25 MED ORDER — HYDROCODONE-ACETAMINOPHEN 5-325 MG PO TABS
1.0000 | ORAL_TABLET | ORAL | Status: DC | PRN
  Administered 2017-11-25: 2 via ORAL
  Filled 2017-11-25 (×2): qty 2

## 2017-11-25 MED ORDER — DOCUSATE SODIUM 100 MG PO CAPS
100.0000 mg | ORAL_CAPSULE | Freq: Two times a day (BID) | ORAL | Status: DC
  Administered 2017-11-25 – 2017-11-26 (×4): 100 mg via ORAL
  Filled 2017-11-25 (×4): qty 1

## 2017-11-25 MED ORDER — PREMIER PROTEIN SHAKE
11.0000 [oz_av] | Freq: Two times a day (BID) | ORAL | Status: DC
  Administered 2017-11-25: 11 [oz_av] via ORAL

## 2017-11-25 MED ORDER — OXYCODONE-ACETAMINOPHEN 5-325 MG PO TABS
1.0000 | ORAL_TABLET | ORAL | Status: DC | PRN
  Administered 2017-11-25: 2 via ORAL
  Filled 2017-11-25: qty 2

## 2017-11-25 MED ORDER — POTASSIUM CHLORIDE CRYS ER 20 MEQ PO TBCR
40.0000 meq | EXTENDED_RELEASE_TABLET | Freq: Once | ORAL | Status: AC
  Administered 2017-11-25: 40 meq via ORAL
  Filled 2017-11-25: qty 2

## 2017-11-25 MED ORDER — OXCARBAZEPINE 300 MG PO TABS
300.0000 mg | ORAL_TABLET | Freq: Four times a day (QID) | ORAL | Status: DC
  Administered 2017-11-25 – 2017-11-26 (×4): 300 mg via ORAL
  Filled 2017-11-25 (×6): qty 1

## 2017-11-25 MED ORDER — ONDANSETRON HCL 4 MG/2ML IJ SOLN: 4.0000 mg | Freq: Four times a day (QID) | INTRAMUSCULAR | Status: DC | PRN

## 2017-11-25 MED ORDER — SODIUM CHLORIDE 0.9 % IV SOLN
INTRAVENOUS | Status: DC
  Administered 2017-11-25 – 2017-11-26 (×3): via INTRAVENOUS

## 2017-11-25 MED ORDER — ACETAMINOPHEN 650 MG RE SUPP: 650.0000 mg | Freq: Four times a day (QID) | RECTAL | Status: DC | PRN

## 2017-11-25 MED ORDER — VITAMIN C 500 MG PO TABS
1000.0000 mg | ORAL_TABLET | Freq: Every day | ORAL | Status: DC
  Administered 2017-11-25 – 2017-11-26 (×2): 1000 mg via ORAL
  Filled 2017-11-25 (×2): qty 2

## 2017-11-25 MED ORDER — QUETIAPINE FUMARATE 200 MG PO TABS
200.0000 mg | ORAL_TABLET | Freq: Every day | ORAL | Status: DC
  Filled 2017-11-25: qty 1

## 2017-11-25 MED ORDER — TRAZODONE HCL 50 MG PO TABS
25.0000 mg | ORAL_TABLET | Freq: Every evening | ORAL | Status: DC | PRN
  Administered 2017-11-25: 25 mg via ORAL
  Filled 2017-11-25: qty 1

## 2017-11-25 MED ORDER — BISACODYL 5 MG PO TBEC: 5.0000 mg | DELAYED_RELEASE_TABLET | Freq: Every day | ORAL | Status: DC | PRN

## 2017-11-25 MED ORDER — GABAPENTIN 600 MG PO TABS
600.0000 mg | ORAL_TABLET | Freq: Three times a day (TID) | ORAL | Status: DC
  Administered 2017-11-25 – 2017-11-26 (×4): 600 mg via ORAL
  Filled 2017-11-25 (×4): qty 1

## 2017-11-25 MED ORDER — QUETIAPINE FUMARATE 25 MG PO TABS
50.0000 mg | ORAL_TABLET | Freq: Every day | ORAL | Status: DC
  Administered 2017-11-25 – 2017-11-26 (×2): 50 mg via ORAL
  Filled 2017-11-25 (×2): qty 2

## 2017-11-25 MED ORDER — HEPARIN SODIUM (PORCINE) 5000 UNIT/ML IJ SOLN
5000.0000 [IU] | Freq: Three times a day (TID) | INTRAMUSCULAR | Status: DC
  Administered 2017-11-25: 5000 [IU] via SUBCUTANEOUS
  Filled 2017-11-25 (×3): qty 1

## 2017-11-25 MED ORDER — HYDROMORPHONE HCL 1 MG/ML IJ SOLN
1.0000 mg | Freq: Once | INTRAMUSCULAR | Status: AC
  Administered 2017-11-25: 1 mg via INTRAVENOUS
  Filled 2017-11-25: qty 1

## 2017-11-25 MED ORDER — MUPIROCIN 2 % EX OINT
TOPICAL_OINTMENT | Freq: Three times a day (TID) | CUTANEOUS | Status: DC
  Administered 2017-11-25 – 2017-11-26 (×3): via TOPICAL
  Filled 2017-11-25: qty 22

## 2017-11-25 MED ORDER — QUETIAPINE FUMARATE 200 MG PO TABS
200.0000 mg | ORAL_TABLET | Freq: Every day | ORAL | Status: DC
  Administered 2017-11-25 (×2): 200 mg via ORAL
  Filled 2017-11-25 (×2): qty 1

## 2017-11-25 MED ORDER — OCUVITE-LUTEIN PO CAPS
1.0000 | ORAL_CAPSULE | Freq: Every day | ORAL | Status: DC
  Administered 2017-11-25 – 2017-11-26 (×2): 1 via ORAL
  Filled 2017-11-25 (×2): qty 1

## 2017-11-25 MED ORDER — ACETAMINOPHEN 325 MG PO TABS: 650.0000 mg | ORAL_TABLET | Freq: Four times a day (QID) | ORAL | Status: DC | PRN

## 2017-11-25 MED ORDER — HYDROXYZINE HCL 50 MG PO TABS
50.0000 mg | ORAL_TABLET | Freq: Four times a day (QID) | ORAL | Status: DC
  Administered 2017-11-25 – 2017-11-26 (×4): 50 mg via ORAL
  Filled 2017-11-25 (×6): qty 1

## 2017-11-25 MED ORDER — OXYCODONE-ACETAMINOPHEN 7.5-325 MG PO TABS
1.0000 | ORAL_TABLET | ORAL | Status: DC | PRN
  Administered 2017-11-25 – 2017-11-26 (×4): 1 via ORAL
  Filled 2017-11-25 (×4): qty 1

## 2017-11-25 NOTE — Progress Notes (Signed)
Patient foot circumference noted to be 24-cm.  Dr. Nemiah Commander notified.  No new orders.  Continue to elevate foot and apply ice.

## 2017-11-25 NOTE — Progress Notes (Signed)
Initial Nutrition Assessment  DOCUMENTATION CODES:   Not applicable  INTERVENTION:   Premier Protein BID, each supplement provides 160 kcal and 30 grams of protein.   Ocuvite daily for wound healing (provides zinc, vitamin A, vitamin C, Vitamin E, copper, and selenium)  NUTRITION DIAGNOSIS:   Increased nutrient needs related to wound healing as evidenced by increased estimated needs from protein.  GOAL:   Patient will meet greater than or equal to 90% of their needs  MONITOR:   PO intake, Supplement acceptance, Labs, Weight trends  REASON FOR ASSESSMENT:   Malnutrition Screening Tool    ASSESSMENT:   27 y.o. female with a history of renal insufficiency, hypothyroidism and generalized anxiety disorder presenting for copperhead snakebite.   Met with pt and pt's mother in room today. Pt reports good appetite today; reports eating 100% of her breakfast. Per chart, pt is weight stable. RD will order supplements and ocuvite to encourage wound healing.   Medications reviewed and include: colace, heparin, synthroid, vitamin C, NaCl 36m/hr, oxycodone    Labs reviewed: K 3.1(L), Ca 7.6(L)  NUTRITION - FOCUSED PHYSICAL EXAM:    Most Recent Value  Orbital Region  No depletion  Upper Arm Region  No depletion  Thoracic and Lumbar Region  No depletion  Buccal Region  No depletion  Temple Region  No depletion  Clavicle Bone Region  No depletion  Clavicle and Acromion Bone Region  No depletion  Scapular Bone Region  No depletion  Dorsal Hand  No depletion  Patellar Region  No depletion  Anterior Thigh Region  No depletion  Posterior Calf Region  No depletion  Edema (RD Assessment)  None  Hair  Reviewed  Eyes  Reviewed  Mouth  Reviewed  Skin  Reviewed  Nails  Reviewed     Diet Order:   Diet Order           Diet regular Room service appropriate? Yes; Fluid consistency: Thin  Diet effective now         EDUCATION NEEDS:   Education needs have been addressed  Skin:   Skin Assessment: (Snake bite L foot )  Last BM:  4/30  Height:   Ht Readings from Last 1 Encounters:  11/25/17 5' (1.524 m)    Weight:   Wt Readings from Last 1 Encounters:  11/25/17 121 lb 4.1 oz (55 kg)    Ideal Body Weight:  45.4 kg  BMI:  Body mass index is 23.68 kg/m.  Estimated Nutritional Needs:   Kcal:  1500-1700kcal/day   Protein:  55-66g/day   Fluid:  >1.6L/day   CKoleen DistanceMS, RD, LDN Pager #-[phone] Hours Pager: [phone]

## 2017-11-25 NOTE — Progress Notes (Signed)
Patient ID: Martha Proctor, female   DOB: 1991-06-24, 27 y.o.   MRN: [id number]  [facility] PROGRESS NOTE  Dema Timmons WUJ:[id number] DOB: 1991/06/08 DOA: 11/24/2017 PCP: [facility]  HPI/Subjective: Patient seen this morning.  The patient had a very rough night with severe pain.  Patient very upset this morning.  We  Objective: Vitals:   11/25/17 0558 11/25/17 0603  BP: (!) 80/51 (!) 87/66  Pulse: 66 79  Resp: 18   Temp: (!) 97.5 F (36.4 C)   SpO2: 99%     Filed Weights   11/24/17 2126 11/25/17 0053  Weight: 51.3 kg (113 lb) 55 kg (121 lb 4.1 oz)    ROS: Review of Systems  Constitutional: Negative for chills and fever.  Eyes: Negative for blurred vision.  Respiratory: Negative for cough and shortness of breath.   Cardiovascular: Negative for chest pain.  Gastrointestinal: Negative for abdominal pain, constipation, diarrhea, nausea and vomiting.  Genitourinary: Negative for dysuria.  Musculoskeletal: Positive for joint pain.  Neurological: Negative for dizziness and headaches.   Exam: Physical Exam  Constitutional: She is oriented to person, place, and time.  HENT:  Nose: No mucosal edema.  Mouth/Throat: No oropharyngeal exudate or posterior oropharyngeal edema.  Eyes: Pupils are equal, round, and reactive to light. Conjunctivae, EOM and lids are normal.  Neck: No JVD present. Carotid bruit is not present. No edema present. No thyroid mass and no thyromegaly present.  Cardiovascular: S1 normal and S2 normal. Exam reveals no gallop.  No murmur heard. Pulses:      Dorsalis pedis pulses are 2+ on the right side, and 2+ on the left side.  Respiratory: No respiratory distress. She has no wheezes. She has no rhonchi. She has no rales.  GI: Soft. Bowel sounds are normal. There is no tenderness.  Musculoskeletal:       Right ankle: She exhibits no swelling.       Left ankle: She exhibits decreased range of motion and swelling.   Lymphadenopathy:    She has no cervical adenopathy.  Neurological: She is alert and oriented to person, place, and time. No cranial nerve deficit.  Skin: Skin is warm. Nails show no clubbing.  Left foot swelling with one area of a dried scab probably where the snake fang when in.  Some bruising around the foot.  Psychiatric: She has a normal mood and affect.      Data Reviewed: Basic Metabolic Panel: Recent Labs  Lab 11/24/17 2131 11/25/17 0556  NA 139 136  K 3.8 3.1*  CL 102 106  CO2 30 24  GLUCOSE 78 120*  BUN 21* 19  CREATININE 1.05* 0.74  CALCIUM 9.0 7.6*   CBC: Recent Labs  Lab 11/24/17 2131 11/25/17 0556  WBC 7.9 7.9  HGB 14.2 12.2  HCT 42.0 36.1  MCV 93.3 95.6  PLT 247 200    CBG: Recent Labs  Lab 11/25/17 0734  GLUCAP 106*    Scheduled Meds: . docusate sodium  100 mg Oral BID  . gabapentin  600 mg Oral TID  . heparin  5,000 Units Subcutaneous Q8H  . hydrOXYzine  50 mg Oral QID  . levothyroxine  250 mcg Oral QAC breakfast  . multivitamin-lutein  1 capsule Oral Daily  . mupirocin ointment   Topical TID  . Oxcarbazepine  300 mg Oral QID  . protein supplement shake  11 oz Oral BID BM  . QUEtiapine  200 mg Oral QHS   And  .  QUEtiapine  50 mg Oral Daily  . ascorbic acid  1,000 mg Oral Daily   Continuous Infusions: . sodium chloride 75 mL/hr at 11/25/17 1521    Assessment/Plan:  1. Snakebite left foot with severe pain, swelling and bruising.  The patient was given CroFab.  Pain medication with Percocet for moderate pain and Dilaudid for severe pain.  Measurements on swelling. 2. Acute kidney injury and dehydration.  IV fluids.  3. Hypothyroidism unspecified on levothyroxine 4. Anxiety depression continue numerous psychiatric medications  Code Status:     Code Status Orders  (From admission, onward)        Start     Ordered   11/25/17 0051  Full code  Continuous     11/25/17 0050    Code Status History    Date Active Date Inactive  Code Status Order ID Comments User Context   01/17/2017 2310 01/21/2017 2019 Full Code [id number]  Tonye Royalty, DO ED   07/14/2016 1904 07/18/2016 1602 Full Code [id number]  Shaune Pollack, MD Inpatient     Family Communication: Spoke with family at the bedside this morning when I saw her and spoke with the patient's mother late morning. Disposition Plan: .  Evaluate daily on the patient's pain and swelling.  Time spent: 25 minutes  Pattijo Juste [employer]

## 2017-11-25 NOTE — Progress Notes (Signed)
Patient asleep most of day; had told RN this a.m. that she did not want to be touched or want to be bothered.  Awake late afternoon, took oral meds but refused heparin.  Refused to have her temp or O2 level checked during VS.  Foot circumference measures 23-cm.  Ice pack refilled per patient request.

## 2017-11-25 NOTE — Progress Notes (Addendum)
RN notified Dr. Marjie Skiff of patient requesting to her Norco to be changed to Percocet. New order put in at this time. She also complained of erythema of her leg foot increasing. RN assessed and MD notified. No new orderS right now. Will continue to monitor.

## 2017-11-25 NOTE — Progress Notes (Signed)
Foot circumference 23.5-cm

## 2017-11-26 LAB — GLUCOSE, CAPILLARY: Glucose-Capillary: 82 mg/dL (ref 65–99)

## 2017-11-26 MED ORDER — DOCUSATE SODIUM 100 MG PO CAPS: 100.0000 mg | ORAL_CAPSULE | Freq: Two times a day (BID) | ORAL | 0 refills | Status: DC | PRN

## 2017-11-26 MED ORDER — OXYCODONE-ACETAMINOPHEN 7.5-325 MG PO TABS: 1.0000 | ORAL_TABLET | Freq: Four times a day (QID) | ORAL | 0 refills | Status: DC | PRN

## 2017-11-26 MED ORDER — ACETAMINOPHEN 325 MG PO TABS: 650.0000 mg | ORAL_TABLET | Freq: Four times a day (QID) | ORAL | Status: DC | PRN

## 2017-11-26 MED ORDER — PREMIER PROTEIN SHAKE: 11.0000 [oz_av] | Freq: Two times a day (BID) | ORAL | 0 refills | Status: DC

## 2017-11-26 MED ORDER — MUPIROCIN 2 % EX OINT: TOPICAL_OINTMENT | Freq: Three times a day (TID) | CUTANEOUS | 0 refills | Status: DC
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
## 2017-11-26 NOTE — Progress Notes (Signed)
Patient's foot circumference is 23-cm.  Patient's foot is elevated and ice applied.  Martha Proctor

## 2017-11-26 NOTE — Progress Notes (Signed)
11/26/2017  11:10 AM  Martha Proctor to be D/C'd Home per MD order.  Discussed prescriptions and follow up appointments with the patient. Prescriptions given to patient, medication list explained in detail. Pt verbalized understanding.  Allergies as of 11/26/2017      Reactions   Toradol [ketorolac Tromethamine] Anaphylaxis   Tramadol Nausea And Vomiting      Medication List    STOP taking these medications   amitriptyline 25 MG tablet Commonly known as:  ELAVIL     TAKE these medications   acetaminophen 325 MG tablet Commonly known as:  TYLENOL Take 2 tablets (650 mg total) by mouth every 6 (six) hours as needed for mild pain (or Fever >/= 101).   ascorbic acid 1000 MG tablet Commonly known as:  VITAMIN C Take 1 tablet (1,000 mg total) by mouth daily.   docusate sodium 100 MG capsule Commonly known as:  COLACE Take 1 capsule (100 mg total) by mouth 2 (two) times daily as needed for mild constipation.   etonogestrel 68 MG Impl implant Commonly known as:  NEXPLANON Inject into the skin.   gabapentin 600 MG tablet Commonly known as:  NEURONTIN Take 1 tablet (600 mg total) by mouth 3 (three) times daily.   hydrOXYzine 50 MG capsule Commonly known as:  VISTARIL Take 50 mg by mouth 4 (four) times daily.   levothyroxine 200 MCG tablet Commonly known as:  SYNTHROID, LEVOTHROID TAKE 1 TABLET (200 MCG TOTAL) BY MOUTH DAILY. What changed:    how much to take  how to take this  when to take this  additional instructions   mupirocin ointment 2 % Commonly known as:  BACTROBAN Apply topically 3 (three) times daily.   Oxcarbazepine 300 MG tablet Commonly known as:  TRILEPTAL Take 300 mg by mouth 4 (four) times daily.   oxyCODONE-acetaminophen 7.5-325 MG tablet Commonly known as:  PERCOCET Take 1 tablet by mouth every 6 (six) hours as needed for moderate pain.   protein supplement shake Liqd Commonly known as:  PREMIER PROTEIN Take 325 mLs (11 oz total) by  mouth 2 (two) times daily between meals.   QUEtiapine 200 MG tablet Commonly known as:  SEROQUEL Take 200 mg by mouth daily.       Vitals:   11/25/17 2013 11/26/17 0515  BP: 96/61 (!) 88/54  Pulse: 64 71  Resp: (!) 22 20  Temp: 98.6 F (37 C) 99.1 F (37.3 C)  SpO2: 99% 100%    Skin clean, dry and intact without evidence of skin break down, no evidence of skin tears noted. IV catheter discontinued intact. Site without signs and symptoms of complications. Dressing and pressure applied. Pt denies pain at this time. No complaints noted.  An After Visit Summary was printed and given to the patient. Patient escorted via WC, and D/C home via private auto.  Bradly Chris

## 2018-03-25 IMAGING — CT CT RENAL STONE PROTOCOL
2 of 4 series · 16 of 46 positions shown, 18 images · non-contrast
Comparison: 10/25/2014

CLINICAL DATA: Worsening flank pain for the last 3 weeks on the
right side

EXAM:
CT ABDOMEN AND PELVIS WITHOUT CONTRAST
TECHNIQUE: Multidetector CT imaging of the abdomen and pelvis was performed
following the standard protocol without IV contrast.

[Series 2: axial st · axial · 0.63mm/px · z∈[-460,-114]mm · 13 of 77 slices shown, 15 images]
[im 4/77  soft-tissue]
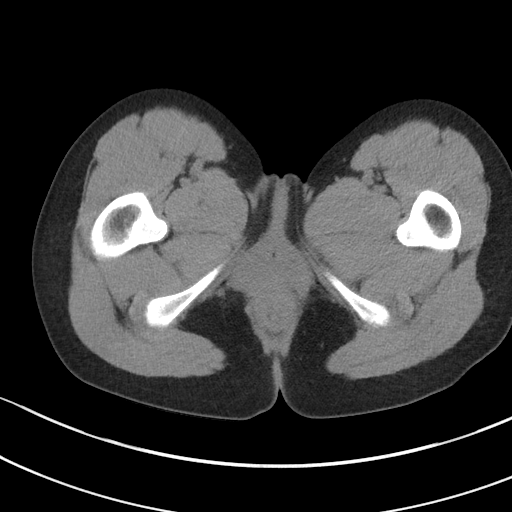
[im 4/77  bone]
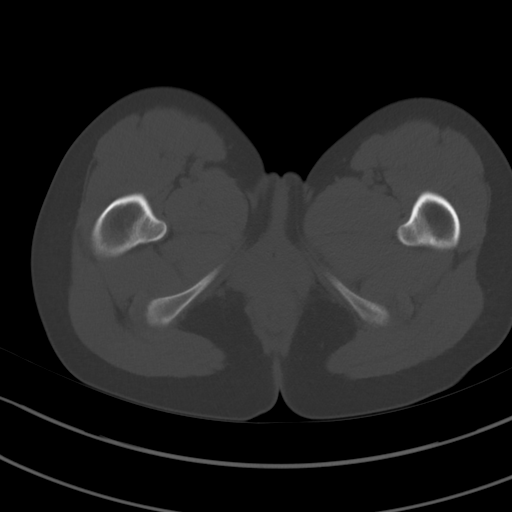
[im 10/77  soft-tissue]
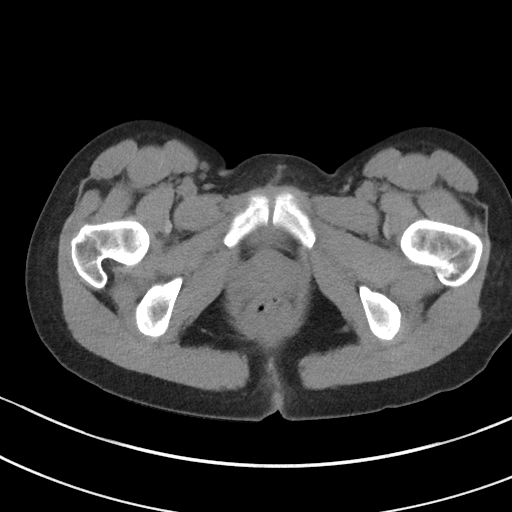
[im 16/77  soft-tissue]
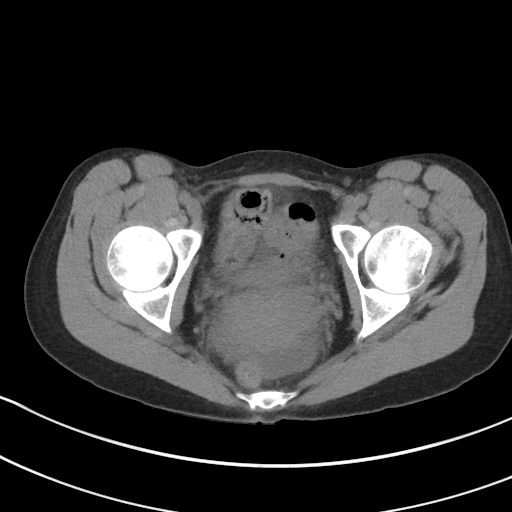
[im 22/77  soft-tissue]
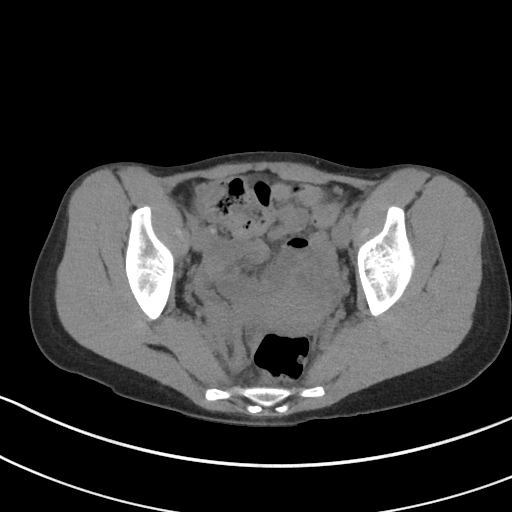
[im 28/77  soft-tissue]
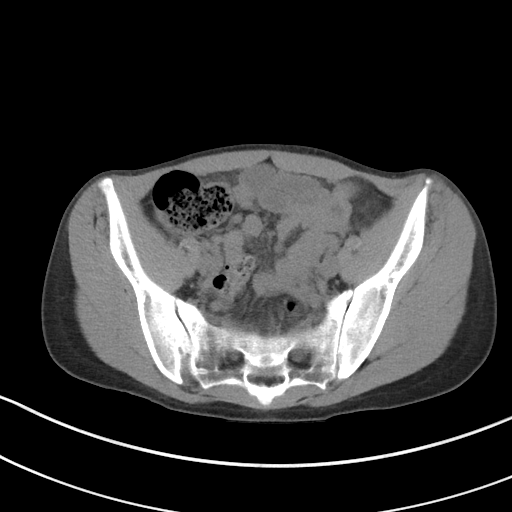
[im 34/77  soft-tissue]
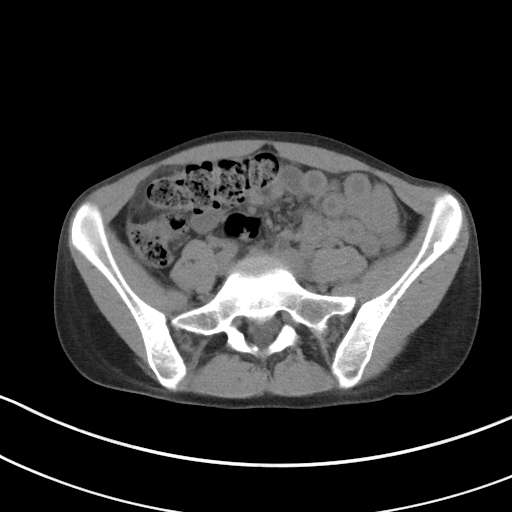
[im 40/77  soft-tissue]
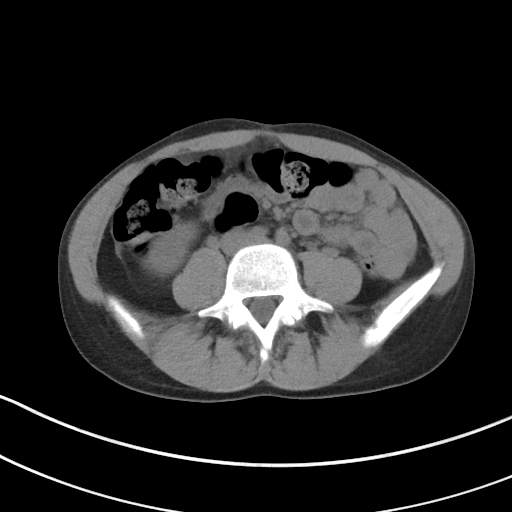
[im 43/77  soft-tissue]
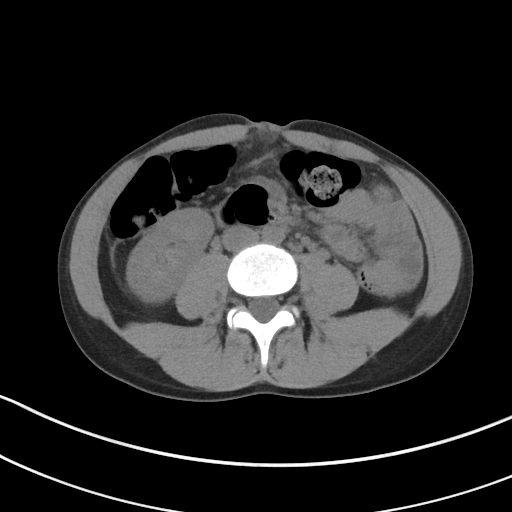
[im 49/77  soft-tissue]
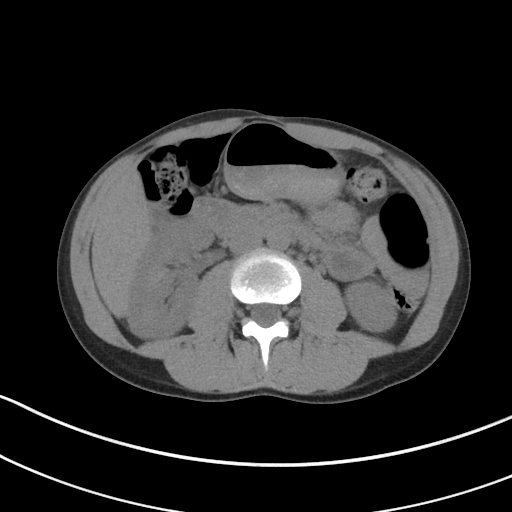
[im 49/77  bone]
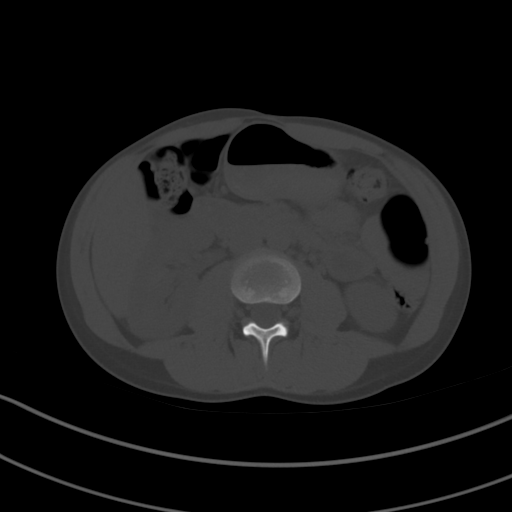
[im 55/77  soft-tissue]
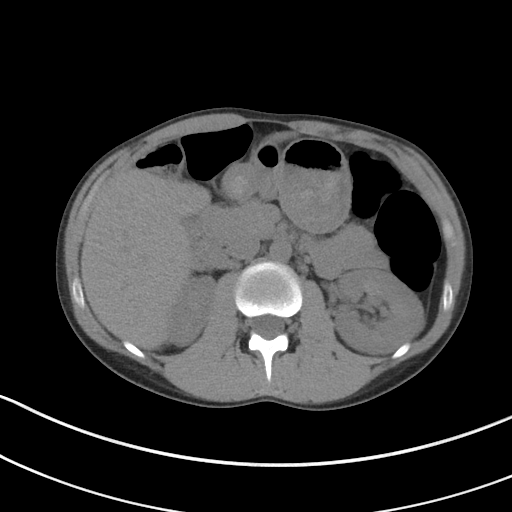
[im 61/77  soft-tissue]
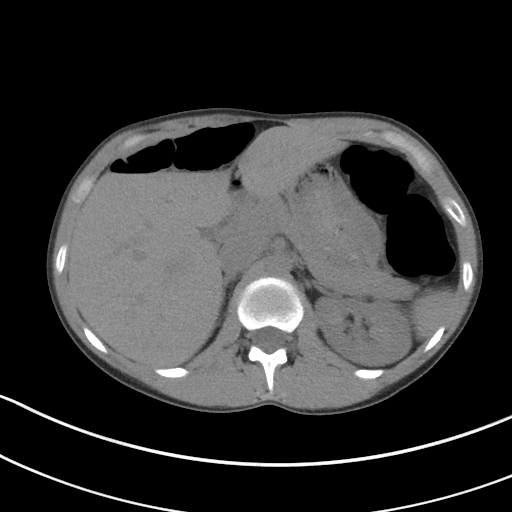
[im 67/77  soft-tissue]
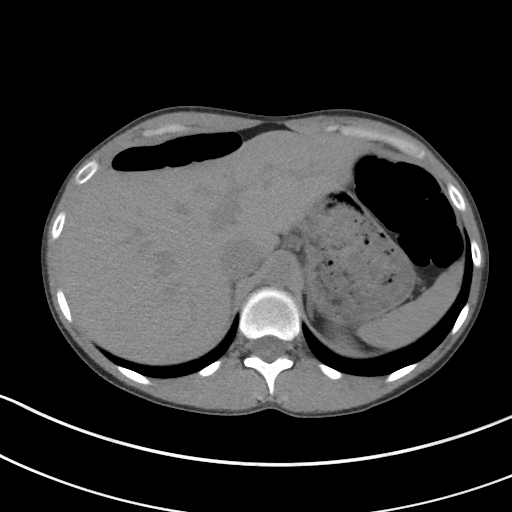
[im 73/77  soft-tissue]
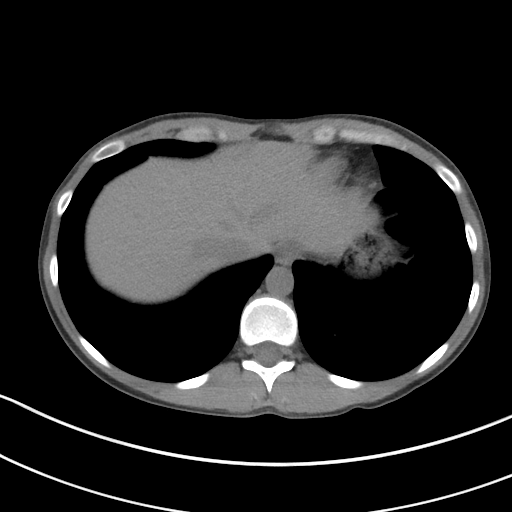

[Series 5: coronal · coronal · 0.61mm/px · 3 of 96 slices shown]
[im 32/96  soft-tissue]
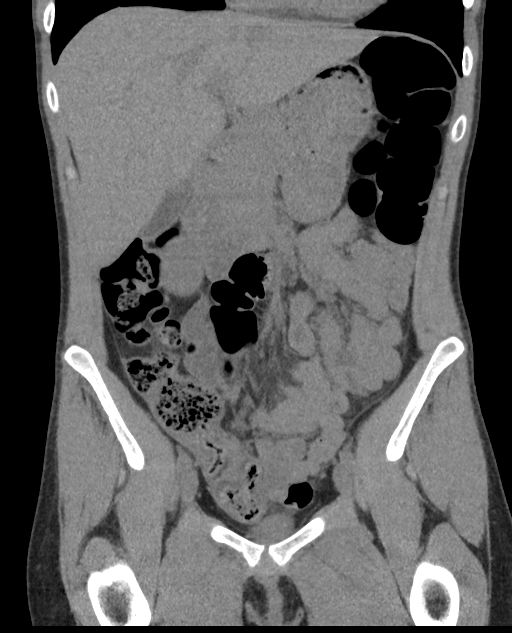
[im 43/96  soft-tissue]
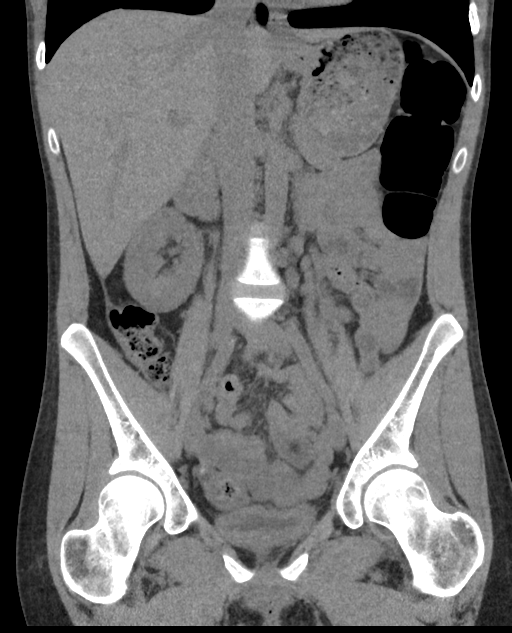
[im 53/96  soft-tissue]
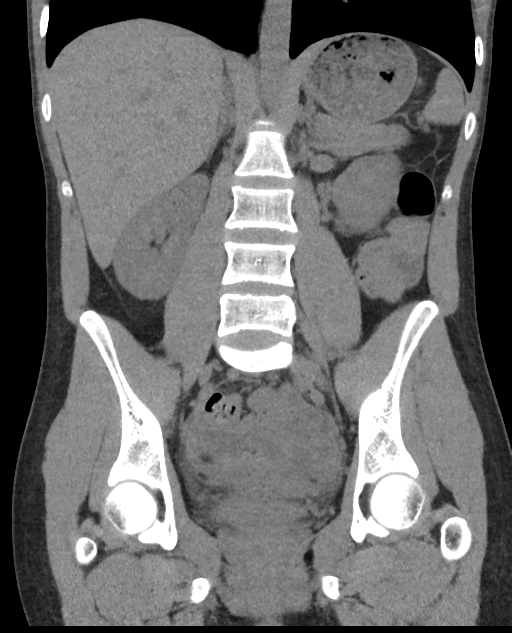

[16 of 46 positions shown; findings below may reference images not displayed]

FINDINGS: Lower chest: No acute abnormality.

Hepatobiliary: No focal liver abnormality is seen. No gallstones,
gallbladder wall thickening, or biliary dilatation.

Pancreas: Unremarkable. No pancreatic ductal dilatation or
surrounding inflammatory changes.

Spleen: Normal in size without focal abnormality.

Adrenals/Urinary Tract: Adrenal glands within normal limits.
Punctate non obstructing stones within the kidneys. No
hydronephrosis or hydroureter. Urinary bladder appears thick walled
but is nearly empty.

Stomach/Bowel: Stomach is nondilated. No dilated small bowel.
Suggestion of thickened loops of distal small bowel in the left
lower quadrant and suggestion of mild wall thickening of the cecum.
Diffuse hazy edema or inflammation within the mesentery of the lower
abdomen/ upper pelvis surrounding the suspected thickened bowel
loops. Appendix demonstrates diffuse increased intraluminal density
but no definite evidence for appendicitis.

Vascular/Lymphatic: No significant vascular findings are present. No
enlarged abdominal or pelvic lymph nodes.

Reproductive: Uterus and bilateral adnexa are unremarkable.

Other: Moderate free fluid in the pelvis.  No free air.

Musculoskeletal: No acute or significant osseous findings.
IMPRESSION: 1. Punctate nonobstructing stones within the kidneys. No
hydronephrosis or hydroureter. Thick-walled urinary bladder could be
due to under distention versus cystitis.
2. Suggestion of thickened but nondilated small bowel loops in the
left lower quadrant with possible wall thickening of the cecum and
ascending colon, further evaluation limited given absence of oral
contrast. Findings could relate to infectious or inflammatory bowel
disease. There appears to be mild edema and soft tissue stranding
diffusely within the mesentery.
3. Moderate free fluid in the pelvis.
4. High-density material or stones within the appendix but no
evidence for appendicitis.

## 2018-07-29 ENCOUNTER — Encounter (HOSPITAL_COMMUNITY): Payer: Self-pay | Admitting: Emergency Medicine

## 2018-07-29 ENCOUNTER — Emergency Department (HOSPITAL_COMMUNITY)
Admission: EM | Admit: 2018-07-29 | Discharge: 2018-07-30 | Disposition: A | Discharge status: Home / Self Care | Payer: Self-pay | Attending: Emergency Medicine | Admitting: Emergency Medicine

## 2018-07-29 ENCOUNTER — Other Ambulatory Visit: Payer: Self-pay

## 2018-07-29 DIAGNOSIS — K5900 Constipation, unspecified: Secondary | ICD-10-CM

## 2018-07-29 DIAGNOSIS — N12 Tubulo-interstitial nephritis, not specified as acute or chronic: Secondary | ICD-10-CM

## 2018-07-29 DIAGNOSIS — R1084 Generalized abdominal pain: Secondary | ICD-10-CM

## 2018-07-29 DIAGNOSIS — R140 Abdominal distension (gaseous): Secondary | ICD-10-CM

## 2018-07-29 DIAGNOSIS — Z79899 Other long term (current) drug therapy: Secondary | ICD-10-CM | POA: Insufficient documentation

## 2018-07-29 DIAGNOSIS — F17210 Nicotine dependence, cigarettes, uncomplicated: Secondary | ICD-10-CM | POA: Insufficient documentation

## 2018-07-29 DIAGNOSIS — N10 Acute tubulo-interstitial nephritis: Secondary | ICD-10-CM | POA: Insufficient documentation

## 2018-07-29 LAB — CBC WITH DIFFERENTIAL/PLATELET
Abs Immature Granulocytes: 0.08 10*3/uL — ABNORMAL HIGH (ref 0.00–0.07)
Basophils Absolute: 0.1 10*3/uL (ref 0.0–0.1)
Basophils Relative: 1 %
Eosinophils Absolute: 0.2 10*3/uL (ref 0.0–0.5)
Eosinophils Relative: 1 %
HCT: 40.6 % (ref 36.0–46.0)
Hemoglobin: 13 g/dL (ref 12.0–15.0)
Immature Granulocytes: 1 %
Lymphocytes Relative: 43 %
Lymphs Abs: 5.6 10*3/uL — ABNORMAL HIGH (ref 0.7–4.0)
MCH: 32.3 pg (ref 26.0–34.0)
MCHC: 32 g/dL (ref 30.0–36.0)
MCV: 101 fL — ABNORMAL HIGH (ref 80.0–100.0)
Monocytes Absolute: 0.6 10*3/uL (ref 0.1–1.0)
Monocytes Relative: 5 %
Neutro Abs: 6.5 10*3/uL (ref 1.7–7.7)
Neutrophils Relative %: 49 %
Platelets: 276 10*3/uL (ref 150–400)
RBC: 4.02 MIL/uL (ref 3.87–5.11)
RDW: 15.9 % — ABNORMAL HIGH (ref 11.5–15.5)
WBC: 13 10*3/uL — ABNORMAL HIGH (ref 4.0–10.5)
nRBC: 0 % (ref 0.0–0.2)

## 2018-07-29 LAB — URINALYSIS, ROUTINE W REFLEX MICROSCOPIC
Bilirubin Urine: NEGATIVE
Glucose, UA: NEGATIVE mg/dL
Ketones, ur: NEGATIVE mg/dL
Nitrite: NEGATIVE
Protein, ur: NEGATIVE mg/dL
Specific Gravity, Urine: 1.02 (ref 1.005–1.030)
WBC, UA: >50 WBC/hpf — ABNORMAL HIGH (ref 0–5)
pH: 5 (ref 5.0–8.0)

## 2018-07-29 LAB — LIPASE, BLOOD: Lipase: 24 U/L (ref 11–51)

## 2018-07-29 LAB — COMPREHENSIVE METABOLIC PANEL
ALT: 15 U/L (ref 0–44)
AST: 22 U/L (ref 15–41)
Albumin: 4.3 g/dL (ref 3.5–5.0)
Alkaline Phosphatase: 49 U/L (ref 38–126)
Anion gap: 9 (ref 5–15)
BUN: 15 mg/dL (ref 6–20)
CO2: 28 mmol/L (ref 22–32)
Calcium: 9.4 mg/dL (ref 8.9–10.3)
Chloride: 101 mmol/L (ref 98–111)
Creatinine, Ser: 0.99 mg/dL (ref 0.44–1.00)
GFR calc Af Amer: >60 mL/min (ref >60)
GFR calc non Af Amer: >60 mL/min (ref >60)
Glucose, Bld: 106 mg/dL — ABNORMAL HIGH (ref 70–99)
Potassium: 4.2 mmol/L (ref 3.5–5.1)
Sodium: 138 mmol/L (ref 135–145)
Total Bilirubin: 0.4 mg/dL (ref 0.3–1.2)
Total Protein: 7.1 g/dL (ref 6.5–8.1)

## 2018-07-29 LAB — I-STAT BETA HCG BLOOD, ED (MC, WL, AP ONLY): I-stat hCG, quantitative: <5 m[IU]/mL (ref <5)

## 2018-07-29 MED ORDER — SODIUM CHLORIDE 0.9 % IV SOLN
1.0000 g | Freq: Once | INTRAVENOUS | Status: AC
  Administered 2018-07-29: 1 g via INTRAVENOUS
  Filled 2018-07-29: qty 10

## 2018-07-29 MED ORDER — FENTANYL CITRATE (PF) 100 MCG/2ML IJ SOLN
50.0000 ug | Freq: Once | INTRAMUSCULAR | Status: AC
  Administered 2018-07-29: 50 ug via INTRAVENOUS
  Filled 2018-07-29: qty 2
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
## 2018-07-29 NOTE — ED Triage Notes (Signed)
Pt states abdomen swelling, and ovarian cyst had ruptured. Pt says this last week she has had increased abdomen swelling and feels like it is a cyst again

## 2018-07-30 ENCOUNTER — Emergency Department (HOSPITAL_COMMUNITY): Payer: Self-pay

## 2018-07-30 MED ORDER — POLYETHYLENE GLYCOL 3350 17 G PO PACK: 17.0000 g | PACK | Freq: Every day | ORAL | 0 refills | Status: DC

## 2018-07-30 MED ORDER — CEPHALEXIN 500 MG PO CAPS: 500.0000 mg | ORAL_CAPSULE | Freq: Four times a day (QID) | ORAL | 0 refills | Status: DC

## 2018-07-30 MED ORDER — IOHEXOL 300 MG/ML  SOLN
100.0000 mL | Freq: Once | INTRAMUSCULAR | Status: AC | PRN
  Administered 2018-07-30: 100 mL via INTRAVENOUS

## 2018-07-30 NOTE — ED Notes (Signed)
Patient transported to CT 

## 2018-07-30 NOTE — Discharge Instructions (Addendum)
1. Medications: Keflex, miralax, usual home medications 2. Treatment: rest, drink plenty of fluids, advance diet slowly 3. Follow Up: Please followup with your primary doctor and OB/GYN in 2 days for discussion of your diagnoses and further evaluation after today's visit; if you do not have a primary care doctor use the resource guide provided to find one; Please return to the ER for persistent vomiting, high fevers or worsening symptoms

## 2018-07-30 NOTE — ED Notes (Signed)
Pt stable and ambulatory for discharge, states understanding follow up.  

## 2018-08-01 LAB — URINE CULTURE: Culture: >=100000 — AB

## 2018-08-02 ENCOUNTER — Telehealth: Payer: Self-pay | Admitting: *Deleted

## 2018-08-02 NOTE — Telephone Encounter (Signed)
Post ED Visit - Positive Culture Follow-up  Culture report reviewed by antimicrobial stewardship pharmacist:  []  Enzo Bi, Pharm.D. []  Celedonio Miyamoto, [address].D., BCPS AQ-ID []  Garvin Fila, Pharm.D., BCPS []  Georgina Pillion, [address].D., BCPS []  Annabella, [address].D., BCPS, AAHIVP []  Estella Husk, Pharm.D., BCPS, AAHIVP [x]  Lysle Pearl, PharmD, BCPS []  Phillips Climes, PharmD, BCPS []  Agapito Games, PharmD, BCPS []  Verlan Friends, PharmD  Positive urine culture Treated with Cephalexin, organism sensitive to the same and no further patient follow-up is required at this time.  Virl [facility] 08/02/2018, 9:37 AM

## 2018-09-02 ENCOUNTER — Emergency Department
Admission: EM | Admit: 2018-09-02 | Discharge: 2018-09-02 | Disposition: A | Discharge status: Home / Self Care | Payer: Self-pay | Attending: Emergency Medicine | Admitting: Emergency Medicine

## 2018-09-02 ENCOUNTER — Other Ambulatory Visit: Payer: Self-pay

## 2018-09-02 ENCOUNTER — Encounter: Payer: Self-pay | Admitting: Emergency Medicine

## 2018-09-02 DIAGNOSIS — E031 Congenital hypothyroidism without goiter: Secondary | ICD-10-CM | POA: Insufficient documentation

## 2018-09-02 DIAGNOSIS — L03012 Cellulitis of left finger: Secondary | ICD-10-CM | POA: Insufficient documentation

## 2018-09-02 DIAGNOSIS — F17210 Nicotine dependence, cigarettes, uncomplicated: Secondary | ICD-10-CM | POA: Insufficient documentation

## 2018-09-02 MED ORDER — HYDROCODONE-ACETAMINOPHEN 5-325 MG PO TABS
1.0000 | ORAL_TABLET | Freq: Once | ORAL | Status: AC
  Administered 2018-09-02: 1 via ORAL
  Filled 2018-09-02: qty 1

## 2018-09-02 MED ORDER — SULFAMETHOXAZOLE-TRIMETHOPRIM 800-160 MG PO TABS
1.0000 | ORAL_TABLET | Freq: Once | ORAL | Status: AC
  Administered 2018-09-02: 1 via ORAL
  Filled 2018-09-02: qty 1

## 2018-09-02 MED ORDER — SULFAMETHOXAZOLE-TRIMETHOPRIM 800-160 MG PO TABS: 1.0000 | ORAL_TABLET | Freq: Two times a day (BID) | ORAL | 0 refills | Status: AC

## 2018-09-02 NOTE — ED Triage Notes (Addendum)
Pt reports raised area on the left index finger x1 week and pt popped the area and since finger has worsened in pain and redness as well. Area is red and raised on appearance. No drainage noted. Pt reports she has been taking "red pills" that are antibiotics for 5 days. Area is localized, no streaking.

## 2018-09-02 NOTE — Discharge Instructions (Signed)
You are being treated for a cellulitis of the index finger. Take the prescription antibiotic as directed. Take OTC Tylenol and Motrin for pain. Keep the wound clean, dry, and covered. Use only soap & water to cleanse.

## 2018-09-02 NOTE — ED Notes (Signed)
Pt in with nickel size red swollen area to left 2nd finger for 1 week. Unknown injury, states has drained whitish pus. Pt denies any fever or hx of the same.
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
## 2018-09-07 ENCOUNTER — Other Ambulatory Visit: Payer: Self-pay

## 2018-09-07 ENCOUNTER — Emergency Department
Admission: EM | Admit: 2018-09-07 | Discharge: 2018-09-07 | Disposition: A | Discharge status: Home / Self Care | Payer: Self-pay | Attending: Emergency Medicine | Admitting: Emergency Medicine

## 2018-09-07 ENCOUNTER — Encounter: Payer: Self-pay | Admitting: Emergency Medicine

## 2018-09-07 DIAGNOSIS — Z79899 Other long term (current) drug therapy: Secondary | ICD-10-CM | POA: Insufficient documentation

## 2018-09-07 DIAGNOSIS — F17210 Nicotine dependence, cigarettes, uncomplicated: Secondary | ICD-10-CM | POA: Insufficient documentation

## 2018-09-07 DIAGNOSIS — L0291 Cutaneous abscess, unspecified: Secondary | ICD-10-CM

## 2018-09-07 DIAGNOSIS — E039 Hypothyroidism, unspecified: Secondary | ICD-10-CM | POA: Insufficient documentation

## 2018-09-07 DIAGNOSIS — L02512 Cutaneous abscess of left hand: Secondary | ICD-10-CM | POA: Insufficient documentation

## 2018-09-07 LAB — COMPREHENSIVE METABOLIC PANEL
ALT: 11 U/L (ref 0–44)
AST: 19 U/L (ref 15–41)
Albumin: 4.9 g/dL (ref 3.5–5.0)
Alkaline Phosphatase: 48 U/L (ref 38–126)
Anion gap: 8 (ref 5–15)
BILIRUBIN TOTAL: 0.5 mg/dL (ref 0.3–1.2)
BUN: 19 mg/dL (ref 6–20)
CO2: 25 mmol/L (ref 22–32)
Calcium: 9 mg/dL (ref 8.9–10.3)
Chloride: 100 mmol/L (ref 98–111)
Creatinine, Ser: 0.93 mg/dL (ref 0.44–1.00)
GFR calc Af Amer: >60 mL/min (ref >60)
GFR calc non Af Amer: >60 mL/min (ref >60)
Glucose, Bld: 91 mg/dL (ref 70–99)
Potassium: 4 mmol/L (ref 3.5–5.1)
Sodium: 133 mmol/L — ABNORMAL LOW (ref 135–145)
TOTAL PROTEIN: 8.2 g/dL — AB (ref 6.5–8.1)

## 2018-09-07 LAB — CBC WITH DIFFERENTIAL/PLATELET
Abs Immature Granulocytes: 0.04 10*3/uL (ref 0.00–0.07)
BASOS PCT: 1 %
Basophils Absolute: 0.1 10*3/uL (ref 0.0–0.1)
Eosinophils Absolute: 0.1 10*3/uL (ref 0.0–0.5)
Eosinophils Relative: 1 %
HCT: 39.9 % (ref 36.0–46.0)
Hemoglobin: 13.6 g/dL (ref 12.0–15.0)
Immature Granulocytes: 0 %
Lymphocytes Relative: 40 %
Lymphs Abs: 4.5 10*3/uL — ABNORMAL HIGH (ref 0.7–4.0)
MCH: 33.7 pg (ref 26.0–34.0)
MCHC: 34.1 g/dL (ref 30.0–36.0)
MCV: 98.8 fL (ref 80.0–100.0)
MONO ABS: 0.6 10*3/uL (ref 0.1–1.0)
Monocytes Relative: 5 %
Neutro Abs: 6.1 10*3/uL (ref 1.7–7.7)
Neutrophils Relative %: 53 %
Platelets: 307 10*3/uL (ref 150–400)
RBC: 4.04 MIL/uL (ref 3.87–5.11)
RDW: 14.6 % (ref 11.5–15.5)
WBC: 11.5 10*3/uL — ABNORMAL HIGH (ref 4.0–10.5)
nRBC: 0 % (ref 0.0–0.2)

## 2018-09-07 LAB — I-STAT BETA HCG BLOOD, ED (NOT ORDERABLE): I-stat hCG, quantitative: <5 m[IU]/mL (ref <5)

## 2018-09-07 MED ORDER — SODIUM CHLORIDE 0.9% FLUSH: 3.0000 mL | Freq: Once | INTRAVENOUS | Status: DC

## 2018-09-07 MED ORDER — PENTAFLUOROPROP-TETRAFLUOROETH EX AERO
INHALATION_SPRAY | CUTANEOUS | Status: DC | PRN
  Administered 2018-09-07: 20:00:00 via TOPICAL

## 2018-09-07 MED ORDER — OXYCODONE-ACETAMINOPHEN 5-325 MG PO TABS: 1.0000 | ORAL_TABLET | ORAL | 0 refills | Status: DC | PRN

## 2018-09-07 MED ORDER — CLINDAMYCIN HCL 300 MG PO CAPS: 300.0000 mg | ORAL_CAPSULE | Freq: Three times a day (TID) | ORAL | 0 refills | Status: AC

## 2018-09-07 MED ORDER — BUPIVACAINE HCL (PF) 0.5 % IJ SOLN
10.0000 mL | Freq: Once | INTRAMUSCULAR | Status: AC
  Administered 2018-09-07: 10 mL
  Filled 2018-09-07: qty 30

## 2018-09-07 MED ORDER — CLINDAMYCIN PHOSPHATE 600 MG/50ML IV SOLN
600.0000 mg | Freq: Once | INTRAVENOUS | Status: AC
  Administered 2018-09-07: 600 mg via INTRAVENOUS
  Filled 2018-09-07: qty 50

## 2018-09-07 MED ORDER — FENTANYL CITRATE (PF) 100 MCG/2ML IJ SOLN
50.0000 ug | Freq: Once | INTRAMUSCULAR | Status: AC
  Administered 2018-09-07: 50 ug via INTRAVENOUS
  Filled 2018-09-07: qty 2

## 2018-09-07 NOTE — ED Triage Notes (Signed)
PT c/o LFT index finger infection, dx with MRSA infection 2/6 and was given antibiotics. Wound appears open w/ + drainage.  VSS

## 2018-09-07 NOTE — Discharge Instructions (Addendum)
Return to the emergency room for any new or worrisome symptoms including increased pain, fever, streaks in the area or other concerns.  Follow closely with your primary care doctor tomorrow for wound check.  If you feel worse however please return.  Not taking the Bactrim, please start taking the clindamycin instead.  Take the pain medication only as prescribed, do not drive on pain medications.
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
## 2018-09-10 LAB — AEROBIC CULTURE W GRAM STAIN (SUPERFICIAL SPECIMEN): Gram Stain: NONE SEEN

## 2018-09-10 LAB — AEROBIC CULTURE  (SUPERFICIAL SPECIMEN)

## 2019-01-22 ENCOUNTER — Other Ambulatory Visit: Payer: Self-pay | Admitting: Family Medicine

## 2019-03-22 ENCOUNTER — Ambulatory Visit: Payer: Self-pay

## 2019-07-06 ENCOUNTER — Other Ambulatory Visit: Payer: Self-pay | Admitting: *Deleted

## 2019-07-06 DIAGNOSIS — Z20822 Contact with and (suspected) exposure to covid-19: Secondary | ICD-10-CM

## 2019-07-08 ENCOUNTER — Telehealth: Payer: Self-pay

## 2019-07-08 ENCOUNTER — Emergency Department
Admission: EM | Admit: 2019-07-08 | Discharge: 2019-07-08 | Disposition: A | Discharge status: Home / Self Care | Payer: Self-pay | Attending: Emergency Medicine | Admitting: Emergency Medicine

## 2019-07-08 ENCOUNTER — Other Ambulatory Visit: Payer: Self-pay

## 2019-07-08 DIAGNOSIS — E031 Congenital hypothyroidism without goiter: Secondary | ICD-10-CM | POA: Insufficient documentation

## 2019-07-08 DIAGNOSIS — F17210 Nicotine dependence, cigarettes, uncomplicated: Secondary | ICD-10-CM | POA: Insufficient documentation

## 2019-07-08 DIAGNOSIS — N12 Tubulo-interstitial nephritis, not specified as acute or chronic: Secondary | ICD-10-CM | POA: Insufficient documentation

## 2019-07-08 DIAGNOSIS — Z79899 Other long term (current) drug therapy: Secondary | ICD-10-CM | POA: Insufficient documentation

## 2019-07-08 LAB — URINALYSIS, COMPLETE (UACMP) WITH MICROSCOPIC
Bilirubin Urine: NEGATIVE
Glucose, UA: NEGATIVE mg/dL
Ketones, ur: 20 mg/dL — AB
Nitrite: POSITIVE — AB
Protein, ur: 100 mg/dL — AB
Specific Gravity, Urine: 1.018 (ref 1.005–1.030)
WBC, UA: >50 WBC/hpf (ref 0–5)
pH: 6 (ref 5.0–8.0)

## 2019-07-08 LAB — COMPREHENSIVE METABOLIC PANEL
ALT: 13 U/L (ref 0–44)
AST: 19 U/L (ref 15–41)
Albumin: 3.4 g/dL — ABNORMAL LOW (ref 3.5–5.0)
Alkaline Phosphatase: 67 U/L (ref 38–126)
Anion gap: 11 (ref 5–15)
BUN: 25 mg/dL — ABNORMAL HIGH (ref 6–20)
CO2: 25 mmol/L (ref 22–32)
Calcium: 8.5 mg/dL — ABNORMAL LOW (ref 8.9–10.3)
Chloride: 94 mmol/L — ABNORMAL LOW (ref 98–111)
Creatinine, Ser: 0.79 mg/dL (ref 0.44–1.00)
GFR calc Af Amer: >60 mL/min (ref >60)
GFR calc non Af Amer: >60 mL/min (ref >60)
Glucose, Bld: 128 mg/dL — ABNORMAL HIGH (ref 70–99)
Potassium: 3.9 mmol/L (ref 3.5–5.1)
Sodium: 130 mmol/L — ABNORMAL LOW (ref 135–145)
Total Bilirubin: 0.7 mg/dL (ref 0.3–1.2)
Total Protein: 7.4 g/dL (ref 6.5–8.1)

## 2019-07-08 LAB — LACTIC ACID, PLASMA: Lactic Acid, Venous: 1.1 mmol/L (ref 0.5–1.9)

## 2019-07-08 LAB — POC URINE PREG, ED: Preg Test, Ur: NEGATIVE

## 2019-07-08 LAB — CBC
HCT: 36.4 % (ref 36.0–46.0)
Hemoglobin: 12.9 g/dL (ref 12.0–15.0)
MCH: 31 pg (ref 26.0–34.0)
MCHC: 35.4 g/dL (ref 30.0–36.0)
MCV: 87.5 fL (ref 80.0–100.0)
Platelets: 180 10*3/uL (ref 150–400)
RBC: 4.16 MIL/uL (ref 3.87–5.11)
RDW: 14.5 % (ref 11.5–15.5)
WBC: 24 10*3/uL — ABNORMAL HIGH (ref 4.0–10.5)
nRBC: 0 % (ref 0.0–0.2)

## 2019-07-08 LAB — NOVEL CORONAVIRUS, NAA: SARS-CoV-2, NAA: NOT DETECTED

## 2019-07-08 LAB — LIPASE, BLOOD: Lipase: 18 U/L (ref 11–51)

## 2019-07-08 MED ORDER — ONDANSETRON 4 MG PO TBDP: 4.0000 mg | ORAL_TABLET | Freq: Once | ORAL | Status: AC

## 2019-07-08 MED ORDER — OXYCODONE HCL 5 MG PO TABS: 5.0000 mg | ORAL_TABLET | Freq: Three times a day (TID) | ORAL | 0 refills | Status: AC | PRN

## 2019-07-08 MED ORDER — SODIUM CHLORIDE 0.9 % IV BOLUS
1000.0000 mL | Freq: Once | INTRAVENOUS | Status: AC
  Administered 2019-07-08: 1000 mL via INTRAVENOUS

## 2019-07-08 MED ORDER — OXYCODONE HCL 5 MG PO TABS
5.0000 mg | ORAL_TABLET | Freq: Once | ORAL | Status: AC
  Administered 2019-07-08: 5 mg via ORAL
  Filled 2019-07-08: qty 1

## 2019-07-08 MED ORDER — ACETAMINOPHEN 325 MG PO TABS
ORAL_TABLET | ORAL | Status: AC
  Administered 2019-07-08: 650 mg via ORAL
  Filled 2019-07-08: qty 2

## 2019-07-08 MED ORDER — ACETAMINOPHEN 325 MG PO TABS: 650.0000 mg | ORAL_TABLET | Freq: Once | ORAL | Status: AC

## 2019-07-08 MED ORDER — CIPROFLOXACIN HCL 500 MG PO TABS: 500.0000 mg | ORAL_TABLET | Freq: Two times a day (BID) | ORAL | 0 refills | Status: AC

## 2019-07-08 MED ORDER — ONDANSETRON 4 MG PO TBDP
ORAL_TABLET | ORAL | Status: AC
  Administered 2019-07-08: 4 mg via ORAL
  Filled 2019-07-08: qty 1

## 2019-07-08 MED ORDER — SODIUM CHLORIDE 0.9 % IV SOLN
1.0000 g | Freq: Once | INTRAVENOUS | Status: AC
  Administered 2019-07-08: 1 g via INTRAVENOUS
  Filled 2019-07-08: qty 10

## 2019-07-08 MED ORDER — ONDANSETRON 4 MG PO TBDP: 4.0000 mg | ORAL_TABLET | Freq: Three times a day (TID) | ORAL | 0 refills | Status: AC | PRN
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
## 2019-07-08 NOTE — Discharge Instructions (Signed)
Your work-up was concerning for pyelonephritis.  We opted not to do CT scan given her prior CT scan did not show kidney stones.  We are discharging you on antibiotics.  You should follow with your urine culture and your primary care in 2 days to ensure that you are getting better.  We gave you nausea medicine as well.  Giving you a few oxycodone to use for breakthrough pain but you should be taking Tylenol 1 g every 8 hours to help with both the pain and the fevers.  Return to the ER for worsening pain, unable to tolerate p.o. or any other concerns.

## 2019-07-08 NOTE — ED Triage Notes (Signed)
Pt to ED reporting vomiting, fever and headache x 3 days. No abd pain. Dysuria x 3 days. Pt reporting she has been unable to keep food or drink down.

## 2019-07-08 NOTE — ED Notes (Signed)
Pt reports not feeling well for the past few days. Pt states that she has been unable to keep anything down. Pt had COVID test and got the results back today and they were negative. Pt denies painful urination, states that her urine does have some odor to it.

## 2019-07-08 NOTE — ED Notes (Signed)
Pt seen outside speaking on the phone, returns to the first nurse desk, loudly asking if there has been any change because she still feels like "shit and I dont want to be here, I want to go the fuck home" pt's phone rings and she screams loudly at the desk, "what the fuck do you want? Mom want fucking leave work to come get me, well fuck you then and your fucking wife." attempt was made to reassure the pt regarding the wait time and need to stay for complete treatment

## 2019-07-08 NOTE — ED Notes (Signed)
Pt cont to cuss people out loudly on the phone

## 2019-07-08 NOTE — Telephone Encounter (Signed)
Patient advise that result are not back yet 

## 2019-07-11 LAB — URINE CULTURE: Culture: >=100000 — AB

## 2019-07-12 NOTE — Progress Notes (Signed)
Brief Pharmacy Note  Patient is a 28 y/o F with medical history of depression, thyroid disease who presented to [facility] ED 12/11 c/o fevers and emesis. Patient endorsed increased urinary frequency, bilateral flank pain. She has had a history of UTIs and pyelonephritis in the past. Point of care pregnancy test negative. WBC elevated to 24.0. UA nitrite positive, >50 WBC, many bacteria. Patient discharged on ciprofloxacin 500 mg two times daily x 10 days. Urine culture from ED visit has resulted >100k colonies/mL E coli (pan-sensitive). Blood cultures with no growth x 4 days. Patient is appropriately covered on discharge antibiotic.   [place] Resident 12 July 2019

## 2019-07-13 LAB — CULTURE, BLOOD (ROUTINE X 2)
Culture: NO GROWTH
Culture: NO GROWTH

## 2020-03-19 ENCOUNTER — Other Ambulatory Visit: Payer: Self-pay

## 2020-03-19 ENCOUNTER — Emergency Department
Admission: EM | Admit: 2020-03-19 | Discharge: 2020-03-20 | Disposition: A | Discharge status: Home / Self Care | Payer: Medicaid Other | Attending: Emergency Medicine | Admitting: Emergency Medicine

## 2020-03-19 ENCOUNTER — Encounter: Payer: Self-pay | Admitting: Emergency Medicine

## 2020-03-19 ENCOUNTER — Emergency Department: Payer: Medicaid Other

## 2020-03-19 DIAGNOSIS — M7989 Other specified soft tissue disorders: Secondary | ICD-10-CM

## 2020-03-19 DIAGNOSIS — Z5321 Procedure and treatment not carried out due to patient leaving prior to being seen by health care provider: Secondary | ICD-10-CM | POA: Insufficient documentation

## 2020-03-19 DIAGNOSIS — F1990 Other psychoactive substance use, unspecified, uncomplicated: Secondary | ICD-10-CM

## 2020-03-19 LAB — BASIC METABOLIC PANEL
Anion gap: 10 (ref 5–15)
BUN: 17 mg/dL (ref 6–20)
CO2: 28 mmol/L (ref 22–32)
Calcium: 8.6 mg/dL — ABNORMAL LOW (ref 8.9–10.3)
Chloride: 101 mmol/L (ref 98–111)
Creatinine, Ser: 1.11 mg/dL — ABNORMAL HIGH (ref 0.44–1.00)
GFR calc Af Amer: >60 mL/min (ref >60)
GFR calc non Af Amer: >60 mL/min (ref >60)
Glucose, Bld: 101 mg/dL — ABNORMAL HIGH (ref 70–99)
Potassium: 4.2 mmol/L (ref 3.5–5.1)
Sodium: 139 mmol/L (ref 135–145)

## 2020-03-19 LAB — CBC WITH DIFFERENTIAL/PLATELET
Abs Immature Granulocytes: 0.04 10*3/uL (ref 0.00–0.07)
Basophils Absolute: 0.1 10*3/uL (ref 0.0–0.1)
Basophils Relative: 1 %
Eosinophils Absolute: 0 10*3/uL (ref 0.0–0.5)
Eosinophils Relative: 0 %
HCT: 37.5 % (ref 36.0–46.0)
Hemoglobin: 12.5 g/dL (ref 12.0–15.0)
Immature Granulocytes: 0 %
Lymphocytes Relative: 27 %
Lymphs Abs: 3 10*3/uL (ref 0.7–4.0)
MCH: 32.3 pg (ref 26.0–34.0)
MCHC: 33.3 g/dL (ref 30.0–36.0)
MCV: 96.9 fL (ref 80.0–100.0)
Monocytes Absolute: 0.7 10*3/uL (ref 0.1–1.0)
Monocytes Relative: 6 %
Neutro Abs: 7.3 10*3/uL (ref 1.7–7.7)
Neutrophils Relative %: 66 %
Platelets: 269 10*3/uL (ref 150–400)
RBC: 3.87 MIL/uL (ref 3.87–5.11)
RDW: 13.7 % (ref 11.5–15.5)
WBC: 11.1 10*3/uL — ABNORMAL HIGH (ref 4.0–10.5)
nRBC: 0 % (ref 0.0–0.2)

## 2020-03-19 NOTE — ED Triage Notes (Signed)
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 No redness or warmth present.

## 2020-03-19 NOTE — ED Notes (Signed)
Pt called for repeat VS x 1.  

## 2020-03-21 ENCOUNTER — Emergency Department
Admission: EM | Admit: 2020-03-21 | Discharge: 2020-03-21 | Disposition: A | Discharge status: Home / Self Care | Payer: Medicaid Other | Attending: Emergency Medicine | Admitting: Emergency Medicine

## 2020-03-21 ENCOUNTER — Other Ambulatory Visit: Payer: Self-pay

## 2020-03-21 DIAGNOSIS — R2231 Localized swelling, mass and lump, right upper limb: Secondary | ICD-10-CM | POA: Insufficient documentation

## 2020-03-21 DIAGNOSIS — M79601 Pain in right arm: Secondary | ICD-10-CM | POA: Insufficient documentation

## 2020-03-21 DIAGNOSIS — Z5321 Procedure and treatment not carried out due to patient leaving prior to being seen by health care provider: Secondary | ICD-10-CM | POA: Insufficient documentation

## 2020-03-21 NOTE — ED Triage Notes (Signed)
Pt to triage via w/c with no distress noted; pt c/o swelling and pain to rt upper arm; no visible swelling noted; here 8/23 for same and had labs and u/s WNL

## 2020-03-23 ENCOUNTER — Telehealth: Payer: Medicaid Other | Admitting: Family

## 2020-03-23 DIAGNOSIS — J029 Acute pharyngitis, unspecified: Secondary | ICD-10-CM

## 2020-03-23 NOTE — Progress Notes (Signed)
We are sorry that you are not feeling well.  Here is how we plan to help!  Your symptoms indicate a likely viral infection (Pharyngitis).   Pharyngitis is inflammation in the back of the throat which can cause a sore throat, scratchiness and sometimes difficulty swallowing.   Pharyngitis is typically caused by a respiratory virus and will just run its course.  Please keep in mind that your symptoms could last up to 10 days.  For throat pain, we recommend over the counter oral pain relief medications such as acetaminophen or aspirin, or anti-inflammatory medications such as ibuprofen or naproxen sodium.  Topical treatments such as oral throat lozenges or sprays may be used as needed.  Avoid close contact with loved ones, especially the very young and elderly.  Remember to wash your hands thoroughly throughout the day as this is the number one way to prevent the spread of infection and wipe down door knobs and counters with disinfectant.  After careful review of your answers, I would not recommend and antibiotic for your condition.  Antibiotics should not be used to treat conditions that we suspect are caused by viruses like the virus that causes the common cold or flu. However, some people can have Strep with atypical symptoms. You may need formal testing in clinic or office to confirm if your symptoms continue or worsen.  Providers prescribe antibiotics to treat infections caused by bacteria. Antibiotics are very powerful in treating bacterial infections when they are used properly.  To maintain their effectiveness, they should be used only when necessary.  Overuse of antibiotics has resulted in the development of super bugs that are resistant to treatment!    Home Care:  Only take medications as instructed by your medical team.  Do not drink alcohol while taking these medications.  A steam or ultrasonic humidifier can help congestion.  You can place a towel over your head and breathe in the steam from  hot water coming from a faucet.  Avoid close contacts especially the very young and the elderly.  Cover your mouth when you cough or sneeze.  Always remember to wash your hands.  Get Help Right Away If:  You develop worsening fever or throat pain.  You develop a severe head ache or visual changes.  Your symptoms persist after you have completed your treatment plan.  Make sure you  Understand these instructions.  Will watch your condition.  Will get help right away if you are not doing well or get worse.  Your e-visit answers were reviewed by a board certified advanced clinical practitioner to complete your personal care plan.  Depending on the condition, your plan could have included both over the counter or prescription medications.  If there is a problem please reply  once you have received a response from your provider.  Your safety is important to [place].  If you have drug allergies check your prescription carefully.    You can use MyChart to ask questions about todays visit, request a non-urgent call back, or ask for a work or school excuse for 24 hours related to this e-Visit. If it has been greater than 24 hours you will need to follow up with your provider, or enter a new e-Visit to address those concerns.  You will get an e-mail in the next two days asking about your experience.  I hope that your e-visit has been valuable and will speed your recovery. Thank you for using e-visits.   Approximately 5 minutes was  documenting and reviewing patient's chart.     

## 2020-06-03 ENCOUNTER — Emergency Department
Admission: EM | Admit: 2020-06-03 | Discharge: 2020-06-03 | Disposition: A | Discharge status: Home / Self Care | Payer: Medicaid Other | Attending: Emergency Medicine | Admitting: Emergency Medicine

## 2020-06-03 ENCOUNTER — Emergency Department: Payer: Medicaid Other

## 2020-06-03 ENCOUNTER — Encounter: Payer: Self-pay | Admitting: Emergency Medicine

## 2020-06-03 ENCOUNTER — Other Ambulatory Visit: Payer: Self-pay

## 2020-06-03 DIAGNOSIS — E031 Congenital hypothyroidism without goiter: Secondary | ICD-10-CM | POA: Insufficient documentation

## 2020-06-03 DIAGNOSIS — Z79899 Other long term (current) drug therapy: Secondary | ICD-10-CM | POA: Insufficient documentation

## 2020-06-03 DIAGNOSIS — N939 Abnormal uterine and vaginal bleeding, unspecified: Secondary | ICD-10-CM | POA: Insufficient documentation

## 2020-06-03 DIAGNOSIS — F17210 Nicotine dependence, cigarettes, uncomplicated: Secondary | ICD-10-CM | POA: Insufficient documentation

## 2020-06-03 LAB — CBC
HCT: 36.6 % (ref 36.0–46.0)
Hemoglobin: 11.9 g/dL — ABNORMAL LOW (ref 12.0–15.0)
MCH: 32.8 pg (ref 26.0–34.0)
MCHC: 32.5 g/dL (ref 30.0–36.0)
MCV: 100.8 fL — ABNORMAL HIGH (ref 80.0–100.0)
Platelets: 305 10*3/uL (ref 150–400)
RBC: 3.63 MIL/uL — ABNORMAL LOW (ref 3.87–5.11)
RDW: 13.4 % (ref 11.5–15.5)
WBC: 8.9 10*3/uL (ref 4.0–10.5)
nRBC: 0 % (ref 0.0–0.2)

## 2020-06-03 LAB — COMPREHENSIVE METABOLIC PANEL
ALT: 21 U/L (ref 0–44)
AST: 23 U/L (ref 15–41)
Albumin: 4.1 g/dL (ref 3.5–5.0)
Alkaline Phosphatase: 84 U/L (ref 38–126)
Anion gap: 11 (ref 5–15)
BUN: 16 mg/dL (ref 6–20)
CO2: 22 mmol/L (ref 22–32)
Calcium: 8.5 mg/dL — ABNORMAL LOW (ref 8.9–10.3)
Chloride: 104 mmol/L (ref 98–111)
Creatinine, Ser: 0.83 mg/dL (ref 0.44–1.00)
GFR, Estimated: >60 mL/min (ref >60)
Glucose, Bld: 84 mg/dL (ref 70–99)
Potassium: 4.2 mmol/L (ref 3.5–5.1)
Sodium: 137 mmol/L (ref 135–145)
Total Bilirubin: 0.5 mg/dL (ref 0.3–1.2)
Total Protein: 7.2 g/dL (ref 6.5–8.1)

## 2020-06-03 LAB — TYPE AND SCREEN
ABO/RH(D): O POS
Antibody Screen: NEGATIVE

## 2020-06-03 LAB — POC URINE PREG, ED: Preg Test, Ur: NEGATIVE
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
## 2021-04-14 ENCOUNTER — Other Ambulatory Visit: Payer: Self-pay

## 2021-04-14 ENCOUNTER — Emergency Department
Admission: EM | Admit: 2021-04-14 | Discharge: 2021-04-14 | Disposition: A | Discharge status: Home / Self Care | Payer: Medicaid Other | Attending: Emergency Medicine | Admitting: Emergency Medicine

## 2021-04-14 DIAGNOSIS — T7421XA Adult sexual abuse, confirmed, initial encounter: Secondary | ICD-10-CM | POA: Insufficient documentation

## 2021-04-14 DIAGNOSIS — N3001 Acute cystitis with hematuria: Secondary | ICD-10-CM | POA: Insufficient documentation

## 2021-04-14 DIAGNOSIS — N939 Abnormal uterine and vaginal bleeding, unspecified: Secondary | ICD-10-CM | POA: Insufficient documentation

## 2021-04-14 DIAGNOSIS — F17210 Nicotine dependence, cigarettes, uncomplicated: Secondary | ICD-10-CM | POA: Insufficient documentation

## 2021-04-14 LAB — CBC WITH DIFFERENTIAL/PLATELET
Abs Immature Granulocytes: 0.03 10*3/uL (ref 0.00–0.07)
Basophils Absolute: 0.1 10*3/uL (ref 0.0–0.1)
Basophils Relative: 1 %
Eosinophils Absolute: 0.1 10*3/uL (ref 0.0–0.5)
Eosinophils Relative: 1 %
HCT: 36.8 % (ref 36.0–46.0)
Hemoglobin: 12.9 g/dL (ref 12.0–15.0)
Immature Granulocytes: 0 %
Lymphocytes Relative: 46 %
Lymphs Abs: 3.2 10*3/uL (ref 0.7–4.0)
MCH: 33.8 pg (ref 26.0–34.0)
MCHC: 35.1 g/dL (ref 30.0–36.0)
MCV: 96.3 fL (ref 80.0–100.0)
Monocytes Absolute: 0.3 10*3/uL (ref 0.1–1.0)
Monocytes Relative: 4 %
Neutro Abs: 3.4 10*3/uL (ref 1.7–7.7)
Neutrophils Relative %: 48 %
Platelets: 230 10*3/uL (ref 150–400)
RBC: 3.82 MIL/uL — ABNORMAL LOW (ref 3.87–5.11)
RDW: 13.3 % (ref 11.5–15.5)
WBC: 7.1 10*3/uL (ref 4.0–10.5)
nRBC: 0 % (ref 0.0–0.2)

## 2021-04-14 LAB — COMPREHENSIVE METABOLIC PANEL
ALT: 23 U/L (ref 0–44)
AST: 25 U/L (ref 15–41)
Albumin: 4.1 g/dL (ref 3.5–5.0)
Alkaline Phosphatase: 44 U/L (ref 38–126)
Anion gap: 8 (ref 5–15)
BUN: 19 mg/dL (ref 6–20)
CO2: 24 mmol/L (ref 22–32)
Calcium: 8.5 mg/dL — ABNORMAL LOW (ref 8.9–10.3)
Chloride: 106 mmol/L (ref 98–111)
Creatinine, Ser: 1.05 mg/dL — ABNORMAL HIGH (ref 0.44–1.00)
GFR, Estimated: >60 mL/min (ref >60)
Glucose, Bld: 80 mg/dL (ref 70–99)
Potassium: 3.8 mmol/L (ref 3.5–5.1)
Sodium: 138 mmol/L (ref 135–145)
Total Bilirubin: 0.9 mg/dL (ref 0.3–1.2)
Total Protein: 6.8 g/dL (ref 6.5–8.1)

## 2021-04-14 LAB — URINALYSIS, COMPLETE (UACMP) WITH MICROSCOPIC
Glucose, UA: NEGATIVE mg/dL
Nitrite: POSITIVE — AB
Protein, ur: 100 mg/dL — AB
Specific Gravity, Urine: >1.03 — ABNORMAL HIGH (ref 1.005–1.030)
WBC, UA: >50 WBC/hpf (ref 0–5)
pH: 5.5 (ref 5.0–8.0)

## 2021-04-14 LAB — RAPID HIV SCREEN (HIV 1/2 AB+AG)
HIV 1/2 Antibodies: NONREACTIVE
HIV-1 P24 Antigen - HIV24: NONREACTIVE

## 2021-04-14 LAB — LIPASE, BLOOD: Lipase: 26 U/L (ref 11–51)

## 2021-04-14 LAB — POC URINE PREG, ED: Preg Test, Ur: NEGATIVE

## 2021-04-14 LAB — CK: Total CK: 202 U/L (ref 38–234)

## 2021-04-14 MED ORDER — LIDOCAINE HCL (PF) 1 % IJ SOLN
INTRAMUSCULAR | Status: AC
  Administered 2021-04-14: 1 mL
  Filled 2021-04-14: qty 5

## 2021-04-14 MED ORDER — METRONIDAZOLE 500 MG PO TABS
2000.0000 mg | ORAL_TABLET | Freq: Once | ORAL | Status: AC
  Administered 2021-04-14: 2000 mg via ORAL
  Filled 2021-04-14: qty 4

## 2021-04-14 MED ORDER — ELVITEG-COBIC-EMTRICIT-TENOFAF 150-150-200-10 MG PO TABS: 1.0000 | ORAL_TABLET | Freq: Every day | ORAL | 0 refills | Status: DC

## 2021-04-14 MED ORDER — AZITHROMYCIN 500 MG PO TABS
1000.0000 mg | ORAL_TABLET | Freq: Once | ORAL | Status: AC
  Administered 2021-04-14: 1000 mg via ORAL

## 2021-04-14 MED ORDER — ULIPRISTAL ACETATE 30 MG PO TABS
30.0000 mg | ORAL_TABLET | Freq: Once | ORAL | Status: AC
  Administered 2021-04-14: 30 mg via ORAL
  Filled 2021-04-14: qty 1

## 2021-04-14 MED ORDER — ELVITEG-COBIC-EMTRICIT-TENOFAF 150-150-200-10 MG PO TABS
1.0000 | ORAL_TABLET | Freq: Every day | ORAL | 0 refills | Status: AC
  Filled 2021-04-14: qty 30, 30d supply, fill #0

## 2021-04-14 MED ORDER — LIDOCAINE HCL (PF) 1 % IJ SOLN: 1.0000 mL | Freq: Once | INTRAMUSCULAR | Status: AC

## 2021-04-14 MED ORDER — CEFTRIAXONE SODIUM 1 G IJ SOLR: 500.0000 mg | Freq: Once | INTRAMUSCULAR | Status: DC

## 2021-04-14 MED ORDER — ELVITEG-COBIC-EMTRICIT-TENOFAF 150-150-200-10 MG PREPACK
1.0000 | ORAL_TABLET | Freq: Once | ORAL | Status: AC
  Administered 2021-04-14: 1 via ORAL
  Filled 2021-04-14: qty 1

## 2021-04-14 MED ORDER — AZITHROMYCIN 500 MG PO TABS
ORAL_TABLET | ORAL | Status: AC
  Filled 2021-04-14: qty 2

## 2021-04-14 MED ORDER — ACETAMINOPHEN 325 MG PO TABS
650.0000 mg | ORAL_TABLET | Freq: Once | ORAL | Status: AC
  Administered 2021-04-14: 650 mg via ORAL
  Filled 2021-04-14: qty 2

## 2021-04-14 MED ORDER — CEFTRIAXONE SODIUM 1 G IJ SOLR
1.0000 g | Freq: Once | INTRAMUSCULAR | Status: AC
  Administered 2021-04-14: 1 g via INTRAMUSCULAR
  Filled 2021-04-14: qty 10

## 2021-04-14 MED ORDER — CEPHALEXIN 500 MG PO CAPS
500.0000 mg | ORAL_CAPSULE | Freq: Four times a day (QID) | ORAL | 0 refills | Status: AC
  Filled 2021-04-14: qty 20, 5d supply, fill #0
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
## 2021-04-14 NOTE — Discharge Instructions (Addendum)
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 pregnant or trying to get pregnant breast-feeding How should I use this medicine? This drug is injected into a muscle or a vein. It is usually given by a health care provider in a hospital or clinic setting. If you get this drug at home, you will be taught how to prepare and give it. Use exactly as directed. Take it as directed on the prescription label at the same time every day. Keep taking it unless your  health care provider tells you to stop. It is important that you put your used needles and syringes in a special sharps container. Do not put them in a trash can. If you do not have a sharps container, call your pharmacist or health care provider to get one. Talk to your health care provider about the use of this drug in children. While it may be prescribed for children as young as newborns for selected conditions, precautions do apply. Overdosage: If you think you have taken too much of this medicine contact a poison control center or emergency room at once. NOTE: This medicine is only for you. Do not share this medicine with others. What if I miss a dose? It is important not to miss your dose. Call your health care provider if you are unable to keep an appointment. If you give yourself this drug at home and you miss a dose, take it as soon as you can. If it is almost time for your next dose, take only that dose. Do not take double or extra doses. What may interact with this medicine? Do not take this medicine with any of the following medications: intravenous calcium This medicine may also interact with the following medications: birth control pills This list may not describe all possible interactions. Give your health care provider a list of all the medicines, herbs, non-prescription drugs, or dietary supplements you use. Also tell them if you smoke, drink alcohol, or use illegal drugs. Some items may interact with your medicine. What should I watch for while using this medicine? Tell your doctor or health care provider if your symptoms do not improve or if they get worse. This medicine may cause serious skin reactions. They can happen weeks to months after starting the medicine. Contact your health care provider right away if you notice fevers or flu-like symptoms with a rash. The rash may be red or purple and then turn into blisters or peeling of the skin. Or, you might notice a red rash with  swelling of the face, lips or lymph nodes in your neck or under your arms. Do not treat diarrhea with over the counter products. Contact your doctor if you have diarrhea that lasts more than 2 days or if it is severe and watery. If you are being treated for a sexually transmitted disease, avoid sexual  contact until you have finished your treatment. Having sex can infect your sexual partner. Calcium may bind to this medicine and cause lung or kidney problems. Avoid calcium products while taking this medicine and for 48 hours after taking the last dose of this medicine. What side effects may I notice from receiving this medicine? Side effects that you should report to your doctor or health care professional as soon as possible: allergic reactions like skin rash, itching or hives, swelling of the face, lips, or tongue breathing problems fever, chills irregular heartbeat pain when passing urine redness, blistering, peeling, or loosening of the skin, including inside the mouth seizures stomach pain, cramps unusual bleeding, bruising unusually weak or tired Side effects that usually do not require medical attention (report to your doctor or health care professional if they continue or are bothersome): diarrhea dizzy, drowsy headache nausea, vomiting pain, swelling, irritation where injected stomach upset sweating This list may not describe all possible side effects. Call your doctor for medical advice about side effects. You may report side effects to FDA at 1-800-FDA-1088. Where should I keep my medicine? Keep out of the reach of children and pets. You will be instructed on how to store this drug. Protect from light. Throw away any unused drug after the expiration date. NOTE: This sheet is a summary. It may not cover all possible information. If you have questions about this medicine, talk to your doctor, pharmacist, or health care provider.  2020 [employer]/Gold Standard (2019-02-17 18:29:21)       Ulipristal oral tablets What is this medicine? ULIPRISTAL (UE li pris tal) is an emergency contraceptive. It prevents pregnancy if taken within 5 days (120 hours) after your regular birth control fails or you have unprotected sex. This medicine will not work if you are already pregnant. This medicine may be used for other purposes; ask your health care provider or pharmacist if you have questions. COMMON BRAND NAME(S): ella What should I tell my health care provider before I take this medicine? They need to know if you have any of these conditions: liver disease an unusual or allergic reaction to ulipristal, other medicines, foods, dyes, or preservatives pregnant or trying to get pregnant breast-feeding How should I use this medicine? Take this medicine by mouth with or without food. Your doctor may want you to use a quick-response pregnancy test prior to using the tablets. Take your medicine as soon as possible and not more than 5 days (120 hours) after the event. This medicine can be taken at any time during your menstrual cycle. Follow the dose instructions of your health care provider exactly. Contact your health care provider right away if you vomit within 3 hours of taking your medicine to discuss if you need to take another tablet. A patient package insert for the product will be given with each prescription and refill. Read this sheet carefully each time. The sheet may change frequently. Contact your pediatrician regarding the use of this medicine in children. Special care may be needed. Overdosage: If you think you have taken too much of this medicine contact a poison control center or emergency room at once. NOTE: This medicine is only for you. Do not share this medicine with others. What if I miss a dose? This medicine is not for regular use. If you vomit within 3 hours of taking your dose, contact your health care professional for instructions. What may interact with this  medicine? This medicine may interact with the following medications: barbiturates such  as phenobarbital or primidone birth control pills bosentan carbamazepine certain medicines for fungal infections like griseofulvin, itraconazole, and ketoconazole certain medicines for HIV or AIDS or hepatitis dabigatran digoxin felbamate fexofenadine oxcarbazepine phenytoin rifampin [facility]. John's Wort topiramate This list may not describe all possible interactions. Give your health care provider a list of all the medicines, herbs, non-prescription drugs, or dietary supplements you use. Also tell them if you smoke, drink alcohol, or use illegal drugs. Some items may interact with your medicine. What should I watch for while using this medicine? Your period may begin a few days earlier or later than expected. If your period is more than 7 days late, pregnancy is possible. See your health care provider as soon as you can and get a pregnancy test. Talk to your healthcare provider before taking this medicine if you know or suspect that you are pregnant. Contact your healthcare provider if you think you may be pregnant and you have taken this medicine. If you have severe abdominal pain about 3 to 5 weeks after taking this medicine, you may have a pregnancy outside the womb, which is called an ectopic or tubal pregnancy. Call your health care provider or go to the nearest emergency room right away if you think this is happening. Discuss birth control options with your health care provider. Emergency birth control is not to be used routinely to prevent pregnancy. It should not be used more than once in the same cycle. Birth control pills may not work properly while you are taking this medicine. Wait at least 5 days after taking this medicine to start or continue other hormone based birth control. Be sure to use a reliable barrier contraceptive method (such as a condom with spermicide) between the time you take this  medicine and your next period. This medicine does not protect you against HIV infection (AIDS) or any other sexually transmitted diseases (STDs). What side effects may I notice from receiving this medicine? Side effects that you should report to your doctor or health care professional as soon as possible: allergic reactions like skin rash, itching or hives, swelling of the face, lips, or tongue Side effects that usually do not require medical attention (report to your doctor or health care professional if they continue or are bothersome): abdominal pain or cramping dizziness headache nausea spotting tiredness This list may not describe all possible side effects. Call your doctor for medical advice about side effects. You may report side effects to FDA at 1-800-FDA-1088. Where should I keep my medicine? Keep out of the reach of children. Store at between 20 and 25 degrees C (68 and 77 degrees F). Protect from light and keep in the blister card inside the original box until you are ready to take it. Throw away any unused medicine after the expiration date. NOTE: This sheet is a summary. It may not cover all possible information. If you have questions about this medicine, talk to your doctor, pharmacist, or health care provider.  2020 [employer]/Gold Standard (2016-11-28 14:27:59)    Elvitegravir; Cobicistat; Emtricitabine; Tenofovir Alafenamide oral tablets   What is this medicine? ELVITEGRAVIR; COBICISTAT; EMTRICITABINE; TENOFOVIR ALAFENAMIDE (el vye TEG ra veer; koe BIS i stat; em tri SIT uh bean; te NOE fo veer) is 3 antiretroviral medicines and a medication booster in 1 tablet. It is used to treat HIV. This medicine is not a cure for HIV. This medicine can lower, but not fully prevent, the risk of spreading HIV to others. This medicine may be  used for other purposes; ask your health care provider or pharmacist if you have questions. COMMON BRAND NAME(S): Genvoya What should I tell my  health care provider before I take this medicine? They need to know if you have any of these conditions: kidney disease liver disease an unusual or allergic reaction to elvitegravir, cobicistat, emtricitabine, tenofovir, other medicines, foods, dyes, or preservatives pregnant or trying to get pregnant breast-feeding How should I use this medicine? Take this medicine by mouth with a glass of water. Follow the directions on the prescription label. Take this medicine with food. Take your medicine at regular intervals. Do not take your medicine more often than directed. For your anti-HIV therapy to work as well as possible, take each dose exactly as prescribed. Do not skip doses or stop your medicine even if you feel better. Skipping doses may make the HIV virus resistant to this medicine and other medicines. Do not stop taking except on your doctor's advice. Talk to your pediatrician regarding the use of this medicine in children. While this drug may be prescribed for selected conditions, precautions do apply. Overdosage: If you think you have taken too much of this medicine contact a poison control center or emergency room at once. NOTE: This medicine is only for you. Do not share this medicine with others. What if I miss a dose? If you miss a dose, take it as soon as you can. If it is almost time for your next dose, take only that dose. Do not take double or extra doses. What may interact with this medicine? Do not take this medicine with any of the following medications: adefovir alfuzosin certain medicines for seizures like carbamazepine, phenobarbital, phenytoin cisapride lumacaftor; ivacaftor lurasidone medicines for cholesterol like lovastatin, simvastatin medicines for headaches like dihydroergotamine, ergotamine, methylergonovine midazolam naloxegol other antiviral medicines for HIV or AIDS pimozide rifampin sildenafil St. John's wort triazolam This medicine may also interact  with the following medications: antacids atorvastatin bosentan buprenorphine; naloxone certain antibiotics like clarithromycin, telithromycin, rifabutin, rifapentine certain medications for anxiety or sleep like buspirone, clorazepate, diazepam, estazolam, flurazepam, zolpidem certain medicines for blood pressure or heart disease like amlodipine, diltiazem, felodipine, metoprolol, nicardipine, nifedipine, timolol, verapamil certain medicines for depression, anxiety, or psychiatric disturbances certain medicines for erectile dysfunction like avanafil, sildenafil, tadalafil, vardenafil certain medicines for fungal infection like itraconazole, ketoconazole, voriconazole certain medicines that treat or prevent blood clots like warfarin, apixaban, betrixaban, dabigatran, edoxaban, and rivaroxaban colchicine cyclosporine female hormones, like estrogens and progestins and birth control pills medicines for infection like acyclovir, cidofovir, valacyclovir, ganciclovir, valganciclovir medicines for irregular heart beat like amiodarone, bepridil, digoxin, disopyramide, dofetilide, flecainide, lidocaine, mexiletine, propafenone, quinidine metformin oxcarbazepine phenothiazines like perphenazine, risperidone, thioridazine salmeterol sirolimus steroid medicines like betamethasone, budesonide, ciclesonide, dexamethasone, fluticasone, methylprednisolone, mometasone, triamcinolone tacrolimus This list may not describe all possible interactions. Give your health care provider a list of all the medicines, herbs, non-prescription drugs, or dietary supplements you use. Also tell them if you smoke, drink alcohol, or use illegal drugs. Some items may interact with your medicine. What should I watch for while using this medicine? Visit your doctor or health care professional for regular check ups. Discuss any new symptoms with your doctor. You will need to have important blood work done while on this  medicine. HIV is spread to others through sexual or blood contact. Talk to your doctor about how to stop the spread of HIV. If you have hepatitis B, talk to your doctor if you plan to stop  this medicine. The symptoms of hepatitis B may get worse if you stop this medicine. Birth control pills may not work properly while you are taking this medicine. Talk to your doctor about using an extra method of birth control. Women who can still have children must use a reliable form of barrier contraception, like a condom. What side effects may I notice from receiving this medicine? Side effects that you should report to your doctor or health care professional as soon as possible: allergic reactions like skin rash, itching or hives, swelling of the face, lips, or tongue breathing problems fast, irregular heartbeat muscle pain or weakness signs and symptoms of kidney injury like trouble passing urine or change in the amount of urine signs and symptoms of liver injury like dark yellow or brown urine; general ill feeling or flu-like symptoms; light-colored stools; loss of appetite; right upper belly pain; unusually weak or tired; yellowing of the eyes or skin Side effects that usually do not require medical attention (report to your doctor or health care professional if they continue or are bothersome): diarrhea headache nausea tiredness This list may not describe all possible side effects. Call your doctor for medical advice about side effects. You may report side effects to FDA at 1-800-FDA-1088. Where should I keep my medicine? Keep out of the reach of children. Store at room temperature below 30 degrees C (86 degrees F). Throw away any unused medicine after the expiration date. NOTE: This sheet is a summary. It may not cover all possible information. If you have questions about this medicine, talk to your doctor, pharmacist, or health care provider.  2020 [employer] Standard (2017-11-23 12:15:37)    .sane
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
## 2021-04-14 NOTE — ED Notes (Signed)
Charge RN called to request SANE consult be called.

## 2021-04-14 NOTE — SANE Note (Signed)
At approximately 8:00 pm, the SANE/FNE Teacher, music) consult has been completed. The primary provider, Dr Antoine Primas was notified. Please contact the SANE/FNE nurse on call (listed in Amion) with any further concerns.
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
## 2021-04-15 ENCOUNTER — Encounter (HOSPITAL_COMMUNITY): Payer: Self-pay

## 2021-04-15 ENCOUNTER — Other Ambulatory Visit (HOSPITAL_COMMUNITY): Payer: Self-pay

## 2021-04-16 ENCOUNTER — Other Ambulatory Visit (HOSPITAL_COMMUNITY): Payer: Self-pay

## 2021-04-16 LAB — URINE CULTURE: Culture: >=100000 — AB

## 2022-01-17 ENCOUNTER — Emergency Department: Payer: Medicaid Other

## 2022-01-17 ENCOUNTER — Encounter: Payer: Self-pay | Admitting: Emergency Medicine

## 2022-01-17 ENCOUNTER — Emergency Department
Admission: EM | Admit: 2022-01-17 | Discharge: 2022-01-17 | Disposition: A | Discharge status: Home / Self Care | Payer: Medicaid Other | Attending: Emergency Medicine | Admitting: Emergency Medicine

## 2022-01-17 DIAGNOSIS — R0789 Other chest pain: Secondary | ICD-10-CM | POA: Insufficient documentation

## 2022-01-17 DIAGNOSIS — E031 Congenital hypothyroidism without goiter: Secondary | ICD-10-CM | POA: Insufficient documentation

## 2022-01-17 DIAGNOSIS — F17200 Nicotine dependence, unspecified, uncomplicated: Secondary | ICD-10-CM | POA: Insufficient documentation

## 2022-01-17 DIAGNOSIS — R7989 Other specified abnormal findings of blood chemistry: Secondary | ICD-10-CM | POA: Insufficient documentation

## 2022-01-17 DIAGNOSIS — E876 Hypokalemia: Secondary | ICD-10-CM | POA: Insufficient documentation

## 2022-01-17 LAB — CBC
HCT: 38 % (ref 36.0–46.0)
Hemoglobin: 12.4 g/dL (ref 12.0–15.0)
MCH: 31.2 pg (ref 26.0–34.0)
MCHC: 32.6 g/dL (ref 30.0–36.0)
MCV: 95.5 fL (ref 80.0–100.0)
Platelets: 413 10*3/uL — ABNORMAL HIGH (ref 150–400)
RBC: 3.98 MIL/uL (ref 3.87–5.11)
RDW: 14.1 % (ref 11.5–15.5)
WBC: 8.8 10*3/uL (ref 4.0–10.5)
nRBC: 0 % (ref 0.0–0.2)

## 2022-01-17 LAB — BASIC METABOLIC PANEL
Anion gap: 8 (ref 5–15)
BUN: 18 mg/dL (ref 6–20)
CO2: 26 mmol/L (ref 22–32)
Calcium: 9.3 mg/dL (ref 8.9–10.3)
Chloride: 102 mmol/L (ref 98–111)
Creatinine, Ser: 1.06 mg/dL — ABNORMAL HIGH (ref 0.44–1.00)
GFR, Estimated: >60 mL/min (ref >60)
Glucose, Bld: 92 mg/dL (ref 70–99)
Potassium: 3.3 mmol/L — ABNORMAL LOW (ref 3.5–5.1)
Sodium: 136 mmol/L (ref 135–145)

## 2022-01-17 LAB — POC URINE PREG, ED: Preg Test, Ur: NEGATIVE

## 2022-01-17 LAB — TROPONIN I (HIGH SENSITIVITY): Troponin I (High Sensitivity): <2 ng/L (ref <18)

## 2022-05-26 DIAGNOSIS — R69 Illness, unspecified: Secondary | ICD-10-CM | POA: Diagnosis not present

## 2022-05-26 DIAGNOSIS — Z79899 Other long term (current) drug therapy: Secondary | ICD-10-CM | POA: Diagnosis not present

## 2022-05-26 DIAGNOSIS — E031 Congenital hypothyroidism without goiter: Secondary | ICD-10-CM | POA: Diagnosis not present

## 2022-05-26 DIAGNOSIS — Z79890 Hormone replacement therapy (postmenopausal): Secondary | ICD-10-CM | POA: Diagnosis not present

## 2022-05-26 DIAGNOSIS — F319 Bipolar disorder, unspecified: Secondary | ICD-10-CM | POA: Diagnosis not present

## 2022-05-26 DIAGNOSIS — F17210 Nicotine dependence, cigarettes, uncomplicated: Secondary | ICD-10-CM | POA: Diagnosis not present

## 2022-07-01 DIAGNOSIS — Z3200 Encounter for pregnancy test, result unknown: Secondary | ICD-10-CM | POA: Diagnosis not present

## 2022-07-04 ENCOUNTER — Other Ambulatory Visit: Payer: Self-pay

## 2022-07-04 ENCOUNTER — Emergency Department
Admission: EM | Admit: 2022-07-04 | Discharge: 2022-07-04 | Disposition: A | Discharge status: Hospice | Payer: 59 | Attending: Emergency Medicine | Admitting: Emergency Medicine

## 2022-07-04 ENCOUNTER — Encounter: Payer: Self-pay | Admitting: Emergency Medicine

## 2022-07-04 DIAGNOSIS — O26899 Other specified pregnancy related conditions, unspecified trimester: Secondary | ICD-10-CM | POA: Diagnosis not present

## 2022-07-04 DIAGNOSIS — O039 Complete or unspecified spontaneous abortion without complication: Secondary | ICD-10-CM | POA: Insufficient documentation

## 2022-07-04 DIAGNOSIS — E039 Hypothyroidism, unspecified: Secondary | ICD-10-CM | POA: Diagnosis not present

## 2022-07-04 DIAGNOSIS — D649 Anemia, unspecified: Secondary | ICD-10-CM | POA: Insufficient documentation

## 2022-07-04 LAB — CBC
HCT: 32.7 % — ABNORMAL LOW (ref 36.0–46.0)
Hemoglobin: 10.2 g/dL — ABNORMAL LOW (ref 12.0–15.0)
MCH: 28.1 pg (ref 26.0–34.0)
MCHC: 31.2 g/dL (ref 30.0–36.0)
MCV: 90.1 fL (ref 80.0–100.0)
Platelets: 692 10*3/uL — ABNORMAL HIGH (ref 150–400)
RBC: 3.63 MIL/uL — ABNORMAL LOW (ref 3.87–5.11)
RDW: 14.7 % (ref 11.5–15.5)
WBC: 11.8 10*3/uL — ABNORMAL HIGH (ref 4.0–10.5)
nRBC: 0 % (ref 0.0–0.2)

## 2022-07-04 LAB — POC URINE PREG, ED: Preg Test, Ur: NEGATIVE

## 2022-07-04 LAB — HCG, QUANTITATIVE, PREGNANCY: hCG, Beta Chain, Quant, S: <1 m[IU]/mL (ref <5)

## 2022-07-04 NOTE — ED Triage Notes (Signed)
Pt comes with c/o possible pregn. Pt states she took test at home and it was positive. Pt states she went to [facility] and states they said it was faint line and she needed to come here to get checked out. Pt denies any cramping or bleeding.

## 2022-07-04 NOTE — Discharge Instructions (Signed)
Your blood pregnancy test today is negative.  You likely had a miscarriage.  Return to the ER for any new or worsening symptoms such as vaginal bleeding, passage of clots or tissue, abdominal or pelvic cramping or pain, weakness or lightheadedness, fever, or any other new or worsening symptoms that concern you.

## 2022-07-04 NOTE — ED Provider Notes (Signed)
[facility] Provider Note    Event Date/Time   First MD Initiated Contact with Patient 07/04/22 1239     (approximate)   History   Possible Pregnancy   HPI  Martha Proctor is a 31 y.o. female with a history of hypothyroidism who presents due to concerns for possible miscarriage.  The patient states that she had her last period in September and had a positive pregnancy test at home about 2 weeks ago.  She had been feeling some lower abdominal sensations that she ascribed to fetal movement.  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  She states that since that time she has not had any pain, bleeding, or other acute symptoms, but also has not perceived any sensations of the pregnancy.  She went to urgent care and was told to come to the ED for further evaluation.  I reviewed the past medical records.  [facility] and had a negative pregnancy test at that time.  She had an ED visit at [facility] in October for opioid withdrawal.   Physical Exam   Triage Vital Signs: ED Triage Vitals  Enc Vitals Group     BP 07/04/22 1018 (!) 139/100     Pulse Rate 07/04/22 1018 (!) 103     Resp 07/04/22 1018 18     Temp 07/04/22 1018 98.3 F (36.8 C)     Temp Source 07/04/22 1018 Oral     SpO2 07/04/22 1018 100 %     Weight 07/04/22 1241 115 lb 1.3 oz (52.2 kg)     Height 07/04/22 1241 5' (1.524 m)     Head Circumference --      Peak Flow --      Pain Score 07/04/22 1021 0     Pain Loc --      Pain Edu? --      Excl. in [place]? --     Most recent vital signs: Vitals:   07/04/22 1018  BP: (!) 139/100  Pulse: (!) 103  Resp: 18  Temp: 98.3 F (36.8 C)  SpO2: 100%     General: Awake, no distress.  CV:  Good peripheral perfusion.  Resp:  Normal effort.  Abd:  No distention.  Other:  Normal gait.   ED Results / Procedures / Treatments   Labs (all labs ordered are listed, but only abnormal results are  displayed) Labs Reviewed  CBC - Abnormal; Notable for the following components:      Result Value   WBC 11.8 (*)    RBC 3.63 (*)    Hemoglobin 10.2 (*)    HCT 32.7 (*)    Platelets 692 (*)    All other components within normal limits  HCG, QUANTITATIVE, PREGNANCY  POC URINE PREG, ED     EKG    RADIOLOGY   PROCEDURES:  Critical Care performed: No  Procedures   MEDICATIONS ORDERED IN ED: Medications - No data to display   IMPRESSION / MDM / [place] / ED COURSE  I reviewed the triage vital signs and the nursing notes.  31 year old female with PMH as noted above presents for confirmation of pregnancy and concern for possible miscarriage after a positive home pregnancy test a few weeks ago.  The patient has no symptoms at this time but also does not remember passing any blood or tissue.  Physical exam is unremarkable.  Urine and serum hCG are  both negative today.  CBC shows anemia but no acute findings.  Differential diagnosis includes, but is not limited to, spontaneous miscarriage.  I do not suspect incomplete miscarriage or retained POC's given that the patient has not had any cramping, pain, or bleeding.  There is no indication for ultrasound or further workup at this time.  The patient is stable for discharge home.  I counseled her on the results of the tests and on return precautions; she expressed understanding.  The patient states that the opiate overdose was unintentional.  She states she was in the "wrong place at the wrong time" and states she is no longer actively using.  She declines substance abuse referral.  Patient's presentation is most consistent with acute complicated illness / injury requiring diagnostic workup.     FINAL CLINICAL IMPRESSION(S) / ED DIAGNOSES   Final diagnoses:  Miscarriage     Rx / DC Orders   ED Discharge Orders     None        Note:  This document was prepared using Dragon voice recognition software and  may include unintentional dictation errors.    Dionne Bucy, MD 07/04/22 1300

## 2022-07-16 ENCOUNTER — Other Ambulatory Visit: Payer: Self-pay

## 2022-07-16 ENCOUNTER — Emergency Department: Payer: 59

## 2022-07-16 ENCOUNTER — Encounter: Payer: Self-pay | Admitting: Emergency Medicine

## 2022-07-16 DIAGNOSIS — H6001 Abscess of right external ear: Secondary | ICD-10-CM | POA: Diagnosis not present

## 2022-07-16 DIAGNOSIS — L0201 Cutaneous abscess of face: Secondary | ICD-10-CM | POA: Insufficient documentation

## 2022-07-16 DIAGNOSIS — H9201 Otalgia, right ear: Secondary | ICD-10-CM | POA: Diagnosis not present

## 2022-07-16 LAB — CBC WITH DIFFERENTIAL/PLATELET
Abs Immature Granulocytes: 0.1 10*3/uL — ABNORMAL HIGH (ref 0.00–0.07)
Basophils Absolute: 0.1 10*3/uL (ref 0.0–0.1)
Basophils Relative: 1 %
Eosinophils Absolute: 0 10*3/uL (ref 0.0–0.5)
Eosinophils Relative: 0 %
HCT: 35.5 % — ABNORMAL LOW (ref 36.0–46.0)
Hemoglobin: 11.2 g/dL — ABNORMAL LOW (ref 12.0–15.0)
Immature Granulocytes: 1 %
Lymphocytes Relative: 11 %
Lymphs Abs: 1.8 10*3/uL (ref 0.7–4.0)
MCH: 28.6 pg (ref 26.0–34.0)
MCHC: 31.5 g/dL (ref 30.0–36.0)
MCV: 90.6 fL (ref 80.0–100.0)
Monocytes Absolute: 0.5 10*3/uL (ref 0.1–1.0)
Monocytes Relative: 3 %
Neutro Abs: 13.1 10*3/uL — ABNORMAL HIGH (ref 1.7–7.7)
Neutrophils Relative %: 84 %
Platelets: 538 10*3/uL — ABNORMAL HIGH (ref 150–400)
RBC: 3.92 MIL/uL (ref 3.87–5.11)
RDW: 15.8 % — ABNORMAL HIGH (ref 11.5–15.5)
WBC: 15.6 10*3/uL — ABNORMAL HIGH (ref 4.0–10.5)
nRBC: 0 % (ref 0.0–0.2)

## 2022-07-16 LAB — COMPREHENSIVE METABOLIC PANEL
ALT: 185 U/L — ABNORMAL HIGH (ref 0–44)
AST: 221 U/L — ABNORMAL HIGH (ref 15–41)
Albumin: 3.4 g/dL — ABNORMAL LOW (ref 3.5–5.0)
Alkaline Phosphatase: 117 U/L (ref 38–126)
Anion gap: 10 (ref 5–15)
BUN: 15 mg/dL (ref 6–20)
CO2: 26 mmol/L (ref 22–32)
Calcium: 8.4 mg/dL — ABNORMAL LOW (ref 8.9–10.3)
Chloride: 99 mmol/L (ref 98–111)
Creatinine, Ser: 0.92 mg/dL (ref 0.44–1.00)
GFR, Estimated: >60 mL/min (ref >60)
Glucose, Bld: 182 mg/dL — ABNORMAL HIGH (ref 70–99)
Potassium: 3.5 mmol/L (ref 3.5–5.1)
Sodium: 135 mmol/L (ref 135–145)
Total Bilirubin: 0.7 mg/dL (ref 0.3–1.2)
Total Protein: 8.4 g/dL — ABNORMAL HIGH (ref 6.5–8.1)

## 2022-07-16 LAB — LACTIC ACID, PLASMA: Lactic Acid, Venous: 2.2 mmol/L (ref 0.5–1.9)

## 2022-07-16 MED ORDER — IOHEXOL 300 MG/ML  SOLN
75.0000 mL | Freq: Once | INTRAMUSCULAR | Status: AC | PRN
  Administered 2022-07-16: 75 mL via INTRAVENOUS

## 2022-07-16 NOTE — ED Provider Triage Note (Signed)
Emergency Medicine Provider Triage Evaluation Note  Martha Proctor , a 31 y.o. female  was evaluated in triage.  Pt complains of abscess around R ear. HX MRSA. Pain, edema. No drainage.  Review of Systems  Positive: Preauricular abscess Negative: Fever, chills  Physical Exam  LMP 03/31/2022 (Approximate)  Gen:   Awake, no distress   Resp:  Normal effort  MSK:   Moves extremities without difficulty  Other:  Large fluctuant area consistent with abscess in the preauricular area R side  Medical Decision Making  Medically screening exam initiated at 9:11 PM.  Appropriate orders placed.  Blondina Pairlee [place] was informed that the remainder of the evaluation will be completed by another provider, this initial triage assessment does not replace that evaluation, and the importance of remaining in the ED until their evaluation is complete.  CT, labs   Lanette Hampshire 07/16/22 2117

## 2022-07-16 NOTE — ED Triage Notes (Signed)
Pt to ED from home c/o abscess to right side head above ear x1 week.  Denies drainage or fevers but has felt chills.  Denies hx of abscess. Hx of MRSA and cellulitis.  Area is red, scabbed, tender.  Christiane Ha, [place] in triage for MSE.

## 2022-07-17 ENCOUNTER — Emergency Department
Admission: EM | Admit: 2022-07-17 | Discharge: 2022-07-17 | Disposition: A | Discharge status: Home / Self Care | Payer: 59 | Attending: Emergency Medicine | Admitting: Emergency Medicine

## 2022-07-17 DIAGNOSIS — L0201 Cutaneous abscess of face: Secondary | ICD-10-CM | POA: Diagnosis not present

## 2022-07-17 DIAGNOSIS — L0291 Cutaneous abscess, unspecified: Secondary | ICD-10-CM

## 2022-07-17 LAB — LACTIC ACID, PLASMA: Lactic Acid, Venous: 1.3 mmol/L (ref 0.5–1.9)

## 2022-07-17 MED ORDER — CEPHALEXIN 500 MG PO CAPS
500.0000 mg | ORAL_CAPSULE | Freq: Once | ORAL | Status: AC
  Administered 2022-07-17: 500 mg via ORAL
  Filled 2022-07-17: qty 1

## 2022-07-17 MED ORDER — SULFAMETHOXAZOLE-TRIMETHOPRIM 800-160 MG PO TABS
1.0000 | ORAL_TABLET | Freq: Once | ORAL | Status: AC
  Administered 2022-07-17: 1 via ORAL
  Filled 2022-07-17: qty 1

## 2022-07-17 MED ORDER — CEPHALEXIN 500 MG PO CAPS
500.0000 mg | ORAL_CAPSULE | Freq: Three times a day (TID) | ORAL | 0 refills | Status: DC
Start: 2022-07-17 — End: 2023-12-15

## 2022-07-17 MED ORDER — OXYCODONE-ACETAMINOPHEN 5-325 MG PO TABS
1.0000 | ORAL_TABLET | Freq: Once | ORAL | Status: AC
  Administered 2022-07-17: 1 via ORAL
  Filled 2022-07-17: qty 1

## 2022-07-17 MED ORDER — SULFAMETHOXAZOLE-TRIMETHOPRIM 800-160 MG PO TABS: 1.0000 | ORAL_TABLET | Freq: Two times a day (BID) | ORAL | 0 refills | Status: DC

## 2022-07-17 NOTE — ED Provider Notes (Signed)
[facility] Provider Note    Event Date/Time   First MD Initiated Contact with Patient 07/17/22 0217     (approximate)   History   Abscess (/)   HPI  Martha Proctor is a 31 y.o. female with a history of anxiety, depression who comes ED complaining of pain at the base next to her right ear for the past week, gradually worsening.  Notes that it started with a pimple.  Denies fever headache vision changes or hearing changes    Physical Exam   Triage Vital Signs: ED Triage Vitals  Enc Vitals Group     BP 07/16/22 2116 (!) 119/90     Pulse Rate 07/16/22 2116 100     Resp 07/16/22 2116 16     Temp 07/16/22 2116 98.1 F (36.7 C)     Temp Source 07/16/22 2116 Oral     SpO2 07/16/22 2116 100 %     Weight 07/16/22 2112 111 lb (50.3 kg)     Height 07/16/22 2112 5' (1.524 m)     Head Circumference --      Peak Flow --      Pain Score 07/16/22 2112 10     Pain Loc --      Pain Edu? --      Excl. in GC? --     Most recent vital signs: Vitals:   07/16/22 2116 07/17/22 0259  BP: (!) 119/90 99/68  Pulse: 100 74  Resp: 16 15  Temp: 98.1 F (36.7 C) 98 F (36.7 C)  SpO2: 100% 100%    General: Awake, no distress.  CV:  Good peripheral perfusion.  Resp:  Normal effort.  Abd:  No distention.  Other:  Large area of erythema fluctuance and tenderness on the right preauricular area.  EOMI, PERRL, no pain with auricular manipulation.   ED Results / Procedures / Treatments   Labs (all labs ordered are listed, but only abnormal results are displayed) Labs Reviewed  COMPREHENSIVE METABOLIC PANEL - Abnormal; Notable for the following components:      Result Value   Glucose, Bld 182 (*)    Calcium 8.4 (*)    Total Protein 8.4 (*)    Albumin 3.4 (*)    AST 221 (*)    ALT 185 (*)    All other components within normal limits  CBC WITH DIFFERENTIAL/PLATELET - Abnormal; Notable for the following components:   WBC 15.6 (*)    Hemoglobin 11.2  (*)    HCT 35.5 (*)    RDW 15.8 (*)    Platelets 538 (*)    Neutro Abs 13.1 (*)    Abs Immature Granulocytes 0.10 (*)    All other components within normal limits  LACTIC ACID, PLASMA - Abnormal; Notable for the following components:   Lactic Acid, Venous 2.2 (*)    All other components within normal limits  LACTIC ACID, PLASMA     RADIOLOGY CT maxillofacial shows a 3 cm superficial abscess, not involving any deep structures.   PROCEDURES:  .Marland KitchenIncision and Drainage  Date/Time: 07/17/2022 4:04 AM  Performed by: Sharman Cheek, MD Authorized by: Sharman Cheek, MD   Consent:    Consent obtained:  Verbal   Consent given by:  Patient   Risks discussed:  Bleeding, infection, incomplete drainage and pain   Alternatives discussed:  Alternative treatment, delayed treatment and observation Universal protocol:    Patient identity confirmed:  Verbally with patient and arm band Location:  Type:  Abscess   Size:  3cm   Location:  Head   Head location:  Face Pre-procedure details:    Skin preparation:  Povidone-iodine Sedation:    Sedation type:  None Anesthesia:    Anesthesia method:  None Procedure type:    Complexity:  Simple Procedure details:    Incision types:  Stab incision   Incision depth:  Dermal   Drainage:  Purulent   Drainage amount:  Copious   Wound treatment:  Wound left open   Packing materials:  None Post-procedure details:    Procedure completion:  Procedure terminated at patient's request    MEDICATIONS ORDERED IN ED: Medications  iohexol (OMNIPAQUE) 300 MG/ML solution 75 mL (75 mLs Intravenous Contrast Given 07/16/22 2239)  oxyCODONE-acetaminophen (PERCOCET/ROXICET) 5-325 MG per tablet 1 tablet (1 tablet Oral Given 07/17/22 0258)  sulfamethoxazole-trimethoprim (BACTRIM DS) 800-160 MG per tablet 1 tablet (1 tablet Oral Given 07/17/22 0258)  cephALEXin (KEFLEX) capsule 500 mg (500 mg Oral Given 07/17/22 0258)     IMPRESSION / MDM /  ASSESSMENT AND PLAN / ED COURSE  I reviewed the triage vital signs and the nursing notes.                              Patient presents with cutaneous abscess on the right face.  Doubt septic arthritis of the TMJ, intracranial abscess, or osteomyelitis.  Not septic.  Incision and drainage performed at bedside with copious thick pus draining.  Procedure terminated with incomplete drainage at patient's request due to pain.  Counseled her to continue applying heat, gently massage the area, take antibiotics as prescribed       FINAL CLINICAL IMPRESSION(S) / ED DIAGNOSES   Final diagnoses:  Abscess     Rx / DC Orders   ED Discharge Orders          Ordered    sulfamethoxazole-trimethoprim (BACTRIM DS) 800-160 MG tablet  2 times daily        07/17/22 0359    cephALEXin (KEFLEX) 500 MG capsule  3 times daily        07/17/22 0359             Note:  This document was prepared using Dragon voice recognition software and may include unintentional dictation errors.   Sharman Cheek, MD 07/17/22 [phone]

## 2023-03-31 ENCOUNTER — Emergency Department
Admission: EM | Admit: 2023-03-31 | Discharge: 2023-03-31 | Discharge status: Against Medical Advice | Payer: MEDICAID | Attending: Emergency Medicine | Admitting: Emergency Medicine

## 2023-03-31 ENCOUNTER — Other Ambulatory Visit: Payer: Self-pay

## 2023-03-31 DIAGNOSIS — F1910 Other psychoactive substance abuse, uncomplicated: Secondary | ICD-10-CM

## 2023-03-31 DIAGNOSIS — F1123 Opioid dependence with withdrawal: Secondary | ICD-10-CM | POA: Insufficient documentation

## 2023-03-31 DIAGNOSIS — Z5329 Procedure and treatment not carried out because of patient's decision for other reasons: Secondary | ICD-10-CM | POA: Diagnosis not present

## 2023-03-31 DIAGNOSIS — F1193 Opioid use, unspecified with withdrawal: Secondary | ICD-10-CM

## 2023-03-31 HISTORY — DX: Hypotension, unspecified: I95.9

## 2023-03-31 MED ORDER — CLONIDINE HCL 0.1 MG PO TABS
0.1000 mg | ORAL_TABLET | Freq: Once | ORAL | Status: AC
  Administered 2023-03-31: 0.1 mg via ORAL
  Filled 2023-03-31: qty 1

## 2023-03-31 MED ORDER — LACTATED RINGERS IV BOLUS
1000.0000 mL | Freq: Once | INTRAVENOUS | Status: AC
  Administered 2023-03-31: 1000 mL via INTRAVENOUS

## 2023-03-31 NOTE — ED Notes (Signed)
Spoke with Press photographer on methods to get pt home. Charge Nurse offered Bus pass at this time.

## 2023-03-31 NOTE — ED Notes (Signed)
Pt pushed call bell. Stating "I'm going to rip my IV out."

## 2023-03-31 NOTE — ED Notes (Signed)
Pt pushed call bell at this time. Pt asking RN for clonidine and to leave. Pt actively pulling on monitor cords and threatening to leave with or without medication. This RN reassured the pt. MD made aware.

## 2023-03-31 NOTE — ED Provider Notes (Signed)
[facility] Provider Note    Event Date/Time   First MD Initiated Contact with Patient 03/31/23 [phone]     (approximate)   History   Withdrawal   HPI Martha Proctor is a 32 y.o. female polysubstance abuse, depression, anxiety who presents today for withdrawal symptoms.  Patient states using fentanyl and methamphetamine last night and she is here to help with withdrawal symptoms.  She notes sweating, diarrhea, and bodyaches all over.  She denies chest pain, shortness of breath, vomiting, abdominal pain.  Separately, she is worried about "infection in her kidneys".  She is however denying any pain with urination or blood in her urine.  Patient is adamant that she does not want to talk with psychiatry and does not want rehabilitation.  She is here solely to help with symptoms and then go home afterwards.     Physical Exam   Triage Vital Signs: ED Triage Vitals  Encounter Vitals Group     BP 03/31/23 0841 106/81     Systolic BP Percentile --      Diastolic BP Percentile --      Pulse Rate 03/31/23 0841 61     Resp 03/31/23 0841 20     Temp 03/31/23 0841 (!) 97.5 F (36.4 C)     Temp src --      SpO2 03/31/23 0841 100 %     Weight 03/31/23 0838 115 lb (52.2 kg)     Height 03/31/23 0838 5' (1.524 m)     Head Circumference --      Peak Flow --      Pain Score 03/31/23 0838 10     Pain Loc --      Pain Education --      Exclude from Growth Chart --     Most recent vital signs: Vitals:   03/31/23 0841 03/31/23 0845  BP: 106/81 106/81  Pulse: 61 64  Resp: 20 18  Temp: (!) 97.5 F (36.4 C)   SpO2: 100% 98%   Physical Exam: I have reviewed the vital signs and nursing notes. General: Awake, alert, no acute distress.  Nontoxic appearing. Head:  Atraumatic, normocephalic.   ENT:  EOM intact, PERRL. Oral mucosa is pink and moist with no lesions. Neck: Neck is supple with full range of motion, No meningeal signs. Cardiovascular:  RRR, No  murmurs. Peripheral pulses palpable and equal bilaterally. Respiratory:  Symmetrical chest wall expansion.  No rhonchi, rales, or wheezes.  Good air movement throughout.  No use of accessory muscles.   Musculoskeletal:  No cyanosis or edema. Moving extremities with full ROM Abdomen:  Soft, nontender, nondistended.  No CVA tenderness bilaterally. Neuro:  GCS 15, moving all four extremities, interacting appropriately. Speech clear. Psych:  Calm, appropriate.   Skin:  Warm, dry, no rash.    ED Results / Procedures / Treatments   Labs (all labs ordered are listed, but only abnormal results are displayed) Labs Reviewed  COMPREHENSIVE METABOLIC PANEL  ETHANOL  CBC  URINE DRUG SCREEN, QUALITATIVE ([facility] ONLY)  URINALYSIS, ROUTINE W REFLEX MICROSCOPIC  POC URINE PREG, ED     EKG My EKG interpretation: Rate of 57, normal sinus rhythm.  Normal axis.  Prolonged QTc of 541.  No acute ST elevations or depressions.   RADIOLOGY    PROCEDURES:  Critical Care performed: No  Procedures   MEDICATIONS ORDERED IN ED: Medications  lactated ringers bolus 1,000 mL (0 mLs Intravenous Stopped 03/31/23 0959)  cloNIDine (CATAPRES)  tablet 0.1 mg (0.1 mg Oral Given 03/31/23 0909)     IMPRESSION / MDM / ASSESSMENT AND PLAN / ED COURSE  I reviewed the triage vital signs and the nursing notes.                              Differential diagnosis includes, but is not limited to, opioid withdrawal, methamphetamine withdrawal, less likely viral illness.  Patient's presentation is most consistent with exacerbation of chronic illness.  Patient is a 32 year old female presenting today for opioid and methamphetamine withdrawal.  She does not want any rehab and wants symptomatic treatment.  Vital signs show slight with low blood pressure 106/81 but otherwise normal heart rate.  Patient is clammy and jittery on exam.  She was given 1 L fluids and clonidine.  Laboratory workup was sent for further evaluation.   Patient ultimately left AMA before workup can be completed.  She did not want further evaluation.  It was discussed with her regarding worsening symptoms, worsening blood pressure, and severe withdrawal symptoms as side effects.  She understood this and still left AMA.  The patient is on the cardiac monitor to evaluate for evidence of arrhythmia and/or significant heart rate changes.     FINAL CLINICAL IMPRESSION(S) / ED DIAGNOSES   Final diagnoses:  Opioid withdrawal (HCC)  Polysubstance abuse (HCC)     Rx / DC Orders   ED Discharge Orders     None        Note:  This document was prepared using Dragon voice recognition software and may include unintentional dictation errors.   Janith Lima, MD 03/31/23 1022

## 2023-03-31 NOTE — ED Notes (Signed)
MD at bedside. 

## 2023-03-31 NOTE — ED Notes (Signed)
This tech into pt room to respond to call bell, pt states she wants her IV out, advised pt this tech would let primary nurse know. Pt also asking if there is anyone who can give her a ride home. She states "they gave me that bus pass right there but I need a direct ride home". Advised pt again that this tech would let primary RN know.

## 2023-03-31 NOTE — ED Notes (Signed)
Pt out in hallway at this time. Stating she found a ride and is leaving. IV removed by this RN. Pt left.

## 2023-03-31 NOTE — ED Notes (Addendum)
Pt refusing bus pass at this time. Keeps stating "yall have to get me home."

## 2023-03-31 NOTE — ED Triage Notes (Signed)
pt in via [facility] from home withdrawing on fentanyl and meth. Last time use was last night. Denies wanting rehab. Denies any other substances or alcohol use. C/o lower back pain. Hx of hypotension and thyriod issues.   Vitals per EMS: hr 60 BP- 108/67 99% on RA BS of 106

## 2023-10-02 ENCOUNTER — Other Ambulatory Visit (HOSPITAL_COMMUNITY): Payer: Self-pay

## 2023-10-16 ENCOUNTER — Ambulatory Visit (LOCAL_COMMUNITY_HEALTH_CENTER)

## 2023-10-16 VITALS — BP 131/93 | Ht 60.0 in | Wt 118.5 lb

## 2023-10-16 DIAGNOSIS — Z3202 Encounter for pregnancy test, result negative: Secondary | ICD-10-CM | POA: Diagnosis not present

## 2023-10-16 DIAGNOSIS — Z3009 Encounter for other general counseling and advice on contraception: Secondary | ICD-10-CM

## 2023-10-16 LAB — PREGNANCY, URINE: Preg Test, Ur: NEGATIVE

## 2023-10-16 NOTE — Progress Notes (Signed)
 Attestation of Attending Supervision of RN:  I was consulted regarding this patient case and agree with the documentation below.   Fayette Pho, MD Clinical Services Medical Director [facility] Department 10/16/23  5:45 PM
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
## 2023-10-17 LAB — BETA HCG QUANT (REF LAB): hCG Quant: <1 m[IU]/mL

## 2023-10-23 ENCOUNTER — Encounter: Payer: Self-pay | Admitting: Family Medicine

## 2023-12-15 ENCOUNTER — Emergency Department

## 2023-12-15 ENCOUNTER — Other Ambulatory Visit: Payer: Self-pay

## 2023-12-15 ENCOUNTER — Emergency Department
Admission: EM | Admit: 2023-12-15 | Discharge: 2023-12-15 | Disposition: A | Discharge status: Home / Self Care | Attending: Emergency Medicine | Admitting: Emergency Medicine

## 2023-12-15 DIAGNOSIS — L02413 Cutaneous abscess of right upper limb: Secondary | ICD-10-CM | POA: Diagnosis not present

## 2023-12-15 DIAGNOSIS — M7989 Other specified soft tissue disorders: Secondary | ICD-10-CM | POA: Diagnosis present

## 2023-12-15 LAB — COMPREHENSIVE METABOLIC PANEL WITH GFR
ALT: 10 U/L (ref 0–44)
AST: 24 U/L (ref 15–41)
Albumin: 3.6 g/dL (ref 3.5–5.0)
Alkaline Phosphatase: 56 U/L (ref 38–126)
Anion gap: 14 (ref 5–15)
BUN: 15 mg/dL (ref 6–20)
CO2: 27 mmol/L (ref 22–32)
Calcium: 8.8 mg/dL — ABNORMAL LOW (ref 8.9–10.3)
Chloride: 95 mmol/L — ABNORMAL LOW (ref 98–111)
Creatinine, Ser: 1.18 mg/dL — ABNORMAL HIGH (ref 0.44–1.00)
GFR, Estimated: >60 mL/min (ref >60)
Glucose, Bld: 125 mg/dL — ABNORMAL HIGH (ref 70–99)
Potassium: 2.9 mmol/L — ABNORMAL LOW (ref 3.5–5.1)
Sodium: 136 mmol/L (ref 135–145)
Total Bilirubin: 0.6 mg/dL (ref 0.0–1.2)
Total Protein: 7.8 g/dL (ref 6.5–8.1)

## 2023-12-15 LAB — CBC WITH DIFFERENTIAL/PLATELET
Abs Immature Granulocytes: 0.05 10*3/uL (ref 0.00–0.07)
Basophils Absolute: 0 10*3/uL (ref 0.0–0.1)
Basophils Relative: 0 %
Eosinophils Absolute: 0.1 10*3/uL (ref 0.0–0.5)
Eosinophils Relative: 1 %
HCT: 28.4 % — ABNORMAL LOW (ref 36.0–46.0)
Hemoglobin: 9.2 g/dL — ABNORMAL LOW (ref 12.0–15.0)
Immature Granulocytes: 1 %
Lymphocytes Relative: 32 %
Lymphs Abs: 3.5 10*3/uL (ref 0.7–4.0)
MCH: 30.5 pg (ref 26.0–34.0)
MCHC: 32.4 g/dL (ref 30.0–36.0)
MCV: 94 fL (ref 80.0–100.0)
Monocytes Absolute: 0.5 10*3/uL (ref 0.1–1.0)
Monocytes Relative: 4 %
Neutro Abs: 6.8 10*3/uL (ref 1.7–7.7)
Neutrophils Relative %: 62 %
Platelets: 376 10*3/uL (ref 150–400)
RBC: 3.02 MIL/uL — ABNORMAL LOW (ref 3.87–5.11)
RDW: 15.4 % (ref 11.5–15.5)
WBC: 10.9 10*3/uL — ABNORMAL HIGH (ref 4.0–10.5)
nRBC: 0 % (ref 0.0–0.2)

## 2023-12-15 LAB — PROTIME-INR
INR: 1.1 (ref 0.8–1.2)
Prothrombin Time: 13.9 s (ref 11.4–15.2)

## 2023-12-15 LAB — LACTIC ACID, PLASMA: Lactic Acid, Venous: 2.7 mmol/L (ref 0.5–1.9)

## 2023-12-15 MED ORDER — ONDANSETRON HCL 4 MG/2ML IJ SOLN: 4.0000 mg | Freq: Once | INTRAMUSCULAR | Status: DC

## 2023-12-15 MED ORDER — SODIUM CHLORIDE 0.9 % IV BOLUS: 1000.0000 mL | Freq: Once | INTRAVENOUS | Status: DC

## 2023-12-15 MED ORDER — DOXYCYCLINE HYCLATE 100 MG PO TABS: 100.0000 mg | ORAL_TABLET | Freq: Two times a day (BID) | ORAL | 0 refills | Status: AC

## 2023-12-15 MED ORDER — FENTANYL CITRATE PF 50 MCG/ML IJ SOSY: 25.0000 ug | PREFILLED_SYRINGE | Freq: Once | INTRAMUSCULAR | Status: DC

## 2023-12-15 MED ORDER — CLONIDINE HCL 0.1 MG PO TABS: 0.1000 mg | ORAL_TABLET | Freq: Two times a day (BID) | ORAL | 0 refills | Status: AC

## 2023-12-15 MED ORDER — LIDOCAINE-EPINEPHRINE (PF) 2 %-1:200000 IJ SOLN
10.0000 mL | Freq: Once | INTRAMUSCULAR | Status: AC
  Administered 2023-12-15: 10 mL via INTRADERMAL
  Filled 2023-12-15: qty 20

## 2023-12-15 NOTE — ED Notes (Signed)
Lab to triage to draw blood work.

## 2023-12-15 NOTE — ED Notes (Signed)
 Patient Alert and oriented to baseline. Stable and ambulatory to baseline. Patient verbalized understanding of the discharge instructions.  Patient belongings were taken by the patient.

## 2023-12-15 NOTE — Discharge Instructions (Addendum)
 Please use ibuprofen (Motrin) up to 800 mg every 8 hours, naproxen (Naprosyn) up to 500 mg every 12 hours, and/or acetaminophen (Tylenol) up to 4 g/day for any continued pain.  Please do not use this medication regimen for longer than 7 days

## 2023-12-15 NOTE — ED Triage Notes (Signed)
 Pt has a abscess in her right AC. Pt sts that she does not know what it is from however, pt has a hx of Meth use and when RN asked her about any Meth usage she said well it could be from that I guess.

## 2023-12-15 NOTE — ED Notes (Signed)
 RN unable to get labs on pt. RN to call phlebotomy for blood draw.

## 2023-12-15 NOTE — ED Notes (Signed)
 MD notified of Lactic result. No new orders at this time.

## 2023-12-15 NOTE — ED Provider Notes (Signed)
 [facility] Provider Note   Event Date/Time   First MD Initiated Contact with Patient 12/15/23 1556     (approximate) History  Abscess  HPI Martha Proctor is a 33 y.o. female with a past medical history of methamphetamine abuse who presents complaining of swelling, erythema, and induration overlying the right AC has been present over the last 4 days and worsening since onset.  Patient endorses subjective fevers but has not taken her temperature.  Patient denies any active IVDU and does not know how this abscess may have started. ROS: Patient currently denies any vision changes, tinnitus, difficulty speaking, facial droop, sore throat, chest pain, shortness of breath, abdominal pain, nausea/vomiting/diarrhea, dysuria, or weakness/numbness/paresthesias in any extremity   Physical Exam  Triage Vital Signs: ED Triage Vitals  Encounter Vitals Group     BP 12/15/23 1527 112/85     Systolic BP Percentile --      Diastolic BP Percentile --      Pulse Rate 12/15/23 1527 73     Resp 12/15/23 1527 18     Temp 12/15/23 1527 (!) 97.4 F (36.3 C)     Temp Source 12/15/23 1527 Oral     SpO2 12/15/23 1527 98 %     Weight 12/15/23 1528 120 lb (54.4 kg)     Height 12/15/23 1528 5' (1.524 m)     Head Circumference --      Peak Flow --      Pain Score 12/15/23 1528 10     Pain Loc --      Pain Education --      Exclude from Growth Chart --    Most recent vital signs: Vitals:   12/15/23 1527 12/15/23 1645  BP: 112/85   Pulse: 73 69  Resp: 18   Temp: (!) 97.4 F (36.3 C)   SpO2: 98% 95%   General: Awake, oriented x4. CV:  Good peripheral perfusion.  Resp:  Normal effort.  Abd:  No distention.  Other:  Middle-aged well-developed, well-nourished Caucasian female resting comfortably in no acute distress 5 cm x 3 cm area of swelling, erythema, and induration to the medial aspect of the right [facility] ED Results / Procedures / Treatments  Labs (all labs ordered  are listed, but only abnormal results are displayed) Labs Reviewed  COMPREHENSIVE METABOLIC PANEL WITH GFR - Abnormal; Notable for the following components:      Result Value   Potassium 2.9 (*)    Chloride 95 (*)    Glucose, Bld 125 (*)    Creatinine, Ser 1.18 (*)    Calcium 8.8 (*)    All other components within normal limits  LACTIC ACID, PLASMA - Abnormal; Notable for the following components:   Lactic Acid, Venous 2.7 (*)    All other components within normal limits  CBC WITH DIFFERENTIAL/PLATELET - Abnormal; Notable for the following components:   WBC 10.9 (*)    RBC 3.02 (*)    Hemoglobin 9.2 (*)    HCT 28.4 (*)    All other components within normal limits  CULTURE, BLOOD (ROUTINE X 2)  CULTURE, BLOOD (ROUTINE X 2)  PROTIME-INR  LACTIC ACID, PLASMA  URINALYSIS, W/ REFLEX TO CULTURE (INFECTION SUSPECTED)  POC URINE PREG, ED   PROCEDURES: Critical Care performed: No .1-3 Lead EKG Interpretation  Performed by: Charleen Conn, MD Authorized by: Charleen Conn, MD     Interpretation: normal     ECG rate:  71  ECG rate assessment: normal     Rhythm: sinus rhythm     Ectopy: none     Conduction: normal   .Incision and Drainage  Date/Time: 12/15/2023 4:55 PM  Performed by: Charleen Conn, MD Authorized by: Charleen Conn, MD   Consent:    Consent obtained:  Verbal   Consent given by:  Patient   Risks, benefits, and alternatives were discussed: yes     Risks discussed:  Bleeding, incomplete drainage, pain, infection and damage to other organs   Alternatives discussed:  No treatment, delayed treatment, alternative treatment and observation Universal protocol:    Immediately prior to procedure, a time out was called: yes     Patient identity confirmed:  Verbally with patient Location:    Type:  Abscess   Size:  5cmx3cm   Location:  Upper extremity   Upper extremity location:  Elbow   Elbow location:  R elbow Pre-procedure details:    Skin preparation:   Povidone-iodine Sedation:    Sedation type:  None Anesthesia:    Anesthesia method:  Local infiltration   Local anesthetic:  Lidocaine  2% WITH epi Procedure type:    Complexity:  Simple Procedure details:    Incision types:  Stab incision   Incision depth:  Subcutaneous   Wound management:  Probed and deloculated and irrigated with saline   Drainage:  Purulent and bloody   Drainage amount:  Moderate   Wound treatment:  Wound left open   Packing materials:  None Post-procedure details:    Procedure completion:  Tolerated well, no immediate complications  MEDICATIONS ORDERED IN ED: Medications  lidocaine -EPINEPHrine  (XYLOCAINE  W/EPI) 2 %-1:200000 (PF) injection 10 mL (10 mLs Intradermal Given by Other 12/15/23 1638)   IMPRESSION / MDM / ASSESSMENT AND PLAN / ED COURSE  I reviewed the triage vital signs and the nursing notes.                             The patient is on the cardiac monitor to evaluate for evidence of arrhythmia and/or significant heart rate changes. Patient's presentation is most consistent with acute presentation with potential threat to life or bodily function. Presentation most consistent with simple Abscess. Given History, Exam, and Workup I have low suspicion for Cellulitis, Necrotizing Fasciitis, Pyomyositis, Sporotrichosis, Osteomyelitis or other emergent problem as a cause for this presentation. Interventions: Patient's abscess has been incised with acceptable resolution  Rx: Doxycycline  100 mg BID x 10 days Disposition: Patient is stable for discharge, advised to follow up with primary care physician in 48 hours.   FINAL CLINICAL IMPRESSION(S) / ED DIAGNOSES   Final diagnoses:  Abscess of arm, right   Rx / DC Orders   ED Discharge Orders          Ordered    Ambulatory Referral to Primary Care (Establish Care)        12/15/23 1653    doxycycline  (VIBRA -TABS) 100 MG tablet  2 times daily        12/15/23 1653    cloNIDine  (CATAPRES ) 0.1 MG tablet   2 times daily        12/15/23 1653           Note:  This document was prepared using Dragon voice recognition software and may include unintentional dictation errors.   Youcef Klas K, MD 12/15/23 [phone]

## 2023-12-20 LAB — CULTURE, BLOOD (ROUTINE X 2)
Culture: NO GROWTH
Special Requests: ADEQUATE
